# Patient Record
Sex: Female | Born: 1970
Health system: Southern US, Community
[De-identification: ages and names within clinical notes are randomized; demographics above are authoritative.]

## PROBLEM LIST (undated history)

## (undated) DIAGNOSIS — I7300 Raynaud's syndrome without gangrene: Secondary | ICD-10-CM

## (undated) DIAGNOSIS — R87629 Unspecified abnormal cytological findings in specimens from vagina: Secondary | ICD-10-CM

## (undated) DIAGNOSIS — Z8619 Personal history of other infectious and parasitic diseases: Secondary | ICD-10-CM

## (undated) DIAGNOSIS — E785 Hyperlipidemia, unspecified: Secondary | ICD-10-CM

## (undated) DIAGNOSIS — R51 Headache: Secondary | ICD-10-CM

## (undated) DIAGNOSIS — T7840XA Allergy, unspecified, initial encounter: Secondary | ICD-10-CM

## (undated) HISTORY — DX: Headache: R51

## (undated) HISTORY — DX: Personal history of other infectious and parasitic diseases: Z86.19

## (undated) HISTORY — PX: COLPOSCOPY: SHX161

## (undated) HISTORY — DX: Raynaud's syndrome without gangrene: I73.00

## (undated) HISTORY — DX: Allergy, unspecified, initial encounter: T78.40XA

## (undated) HISTORY — DX: Hyperlipidemia, unspecified: E78.5

## (undated) HISTORY — DX: Unspecified abnormal cytological findings in specimens from vagina: R87.629

## (undated) HISTORY — PX: FOOT SURGERY: SHX648

---

## 1994-04-08 HISTORY — PX: BREAST REDUCTION SURGERY: SHX8

## 2004-06-18 ENCOUNTER — Ambulatory Visit: Payer: Self-pay | Admitting: Internal Medicine

## 2005-02-14 ENCOUNTER — Emergency Department (HOSPITAL_COMMUNITY): Admission: EM | Admit: 2005-02-14 | Discharge: 2005-02-14 | Payer: Self-pay | Admitting: Family Medicine

## 2005-03-06 ENCOUNTER — Ambulatory Visit: Payer: Self-pay | Admitting: Internal Medicine

## 2005-08-05 ENCOUNTER — Ambulatory Visit: Payer: Self-pay | Admitting: Internal Medicine

## 2006-12-23 ENCOUNTER — Ambulatory Visit: Payer: Self-pay | Admitting: Internal Medicine

## 2006-12-23 DIAGNOSIS — I7300 Raynaud's syndrome without gangrene: Secondary | ICD-10-CM | POA: Insufficient documentation

## 2006-12-23 DIAGNOSIS — M542 Cervicalgia: Secondary | ICD-10-CM | POA: Insufficient documentation

## 2006-12-24 ENCOUNTER — Encounter (INDEPENDENT_AMBULATORY_CARE_PROVIDER_SITE_OTHER): Payer: Self-pay | Admitting: *Deleted

## 2006-12-24 LAB — CONVERTED CEMR LAB
Chloride: 109 meq/L (ref 96–112)
Cholesterol: 165 mg/dL (ref 0–200)
Creatinine, Ser: 0.9 mg/dL (ref 0.4–1.2)
Eosinophils Absolute: 0.1 10*3/uL (ref 0.0–0.6)
Eosinophils Relative: 2.6 % (ref 0.0–5.0)
GFR calc non Af Amer: 76 mL/min
Glucose, Bld: 83 mg/dL (ref 70–99)
HCT: 40.1 % (ref 36.0–46.0)
Hemoglobin: 13.9 g/dL (ref 12.0–15.0)
MCV: 89.7 fL (ref 78.0–100.0)
Monocytes Absolute: 0.3 10*3/uL (ref 0.2–0.7)
Neutrophils Relative %: 60.1 % (ref 43.0–77.0)
Potassium: 4.5 meq/L (ref 3.5–5.1)
RBC: 4.47 M/uL (ref 3.87–5.11)
Sodium: 142 meq/L (ref 135–145)
Total CHOL/HDL Ratio: 4.6
WBC: 5.3 10*3/uL (ref 4.5–10.5)

## 2007-02-16 ENCOUNTER — Ambulatory Visit: Payer: Self-pay | Admitting: Internal Medicine

## 2007-05-04 ENCOUNTER — Telehealth (INDEPENDENT_AMBULATORY_CARE_PROVIDER_SITE_OTHER): Payer: Self-pay | Admitting: *Deleted

## 2007-05-04 ENCOUNTER — Ambulatory Visit: Payer: Self-pay | Admitting: Internal Medicine

## 2007-07-29 ENCOUNTER — Ambulatory Visit: Payer: Self-pay | Admitting: Internal Medicine

## 2007-07-29 DIAGNOSIS — R51 Headache: Secondary | ICD-10-CM | POA: Insufficient documentation

## 2007-07-29 DIAGNOSIS — R519 Headache, unspecified: Secondary | ICD-10-CM | POA: Insufficient documentation

## 2007-07-29 DIAGNOSIS — R5383 Other fatigue: Secondary | ICD-10-CM

## 2007-07-29 DIAGNOSIS — R5381 Other malaise: Secondary | ICD-10-CM | POA: Insufficient documentation

## 2007-07-29 HISTORY — DX: Headache: R51

## 2007-07-30 ENCOUNTER — Ambulatory Visit: Payer: Self-pay | Admitting: Internal Medicine

## 2007-08-02 ENCOUNTER — Encounter (INDEPENDENT_AMBULATORY_CARE_PROVIDER_SITE_OTHER): Payer: Self-pay | Admitting: *Deleted

## 2007-08-02 LAB — CONVERTED CEMR LAB
Albumin: 3.8 g/dL (ref 3.5–5.2)
BUN: 16 mg/dL (ref 6–23)
Basophils Relative: 0 % (ref 0.0–1.0)
Calcium: 9.4 mg/dL (ref 8.4–10.5)
Creatinine, Ser: 0.9 mg/dL (ref 0.4–1.2)
Eosinophils Absolute: 0.1 10*3/uL (ref 0.0–0.7)
Eosinophils Relative: 1.9 % (ref 0.0–5.0)
GFR calc Af Amer: 92 mL/min
GFR calc non Af Amer: 76 mL/min
HCT: 42.7 % (ref 36.0–46.0)
MCV: 91.1 fL (ref 78.0–100.0)
Monocytes Absolute: 0.3 10*3/uL (ref 0.1–1.0)
RBC: 4.69 M/uL (ref 3.87–5.11)
TSH: 1.95 microintl units/mL (ref 0.35–5.50)
WBC: 7 10*3/uL (ref 4.5–10.5)

## 2007-09-01 ENCOUNTER — Ambulatory Visit: Payer: Self-pay | Admitting: Internal Medicine

## 2007-09-01 DIAGNOSIS — L259 Unspecified contact dermatitis, unspecified cause: Secondary | ICD-10-CM | POA: Insufficient documentation

## 2008-02-01 ENCOUNTER — Ambulatory Visit: Payer: Self-pay | Admitting: Internal Medicine

## 2008-02-01 DIAGNOSIS — K219 Gastro-esophageal reflux disease without esophagitis: Secondary | ICD-10-CM | POA: Insufficient documentation

## 2008-03-15 ENCOUNTER — Ambulatory Visit: Payer: Self-pay | Admitting: Internal Medicine

## 2008-04-05 ENCOUNTER — Ambulatory Visit: Payer: Self-pay | Admitting: Internal Medicine

## 2008-04-05 ENCOUNTER — Telehealth (INDEPENDENT_AMBULATORY_CARE_PROVIDER_SITE_OTHER): Payer: Self-pay | Admitting: *Deleted

## 2008-04-18 ENCOUNTER — Ambulatory Visit: Payer: Self-pay | Admitting: Internal Medicine

## 2008-04-20 LAB — CONVERTED CEMR LAB
Cholesterol: 207 mg/dL (ref 0–200)
Hemoglobin: 14.8 g/dL (ref 12.0–15.0)
Triglycerides: 80 mg/dL (ref 0–149)

## 2008-09-23 ENCOUNTER — Ambulatory Visit: Payer: Self-pay | Admitting: Internal Medicine

## 2008-12-29 ENCOUNTER — Encounter (INDEPENDENT_AMBULATORY_CARE_PROVIDER_SITE_OTHER): Payer: Self-pay | Admitting: *Deleted

## 2009-02-20 ENCOUNTER — Telehealth (INDEPENDENT_AMBULATORY_CARE_PROVIDER_SITE_OTHER): Payer: Self-pay | Admitting: *Deleted

## 2009-02-21 ENCOUNTER — Ambulatory Visit: Payer: Self-pay | Admitting: Internal Medicine

## 2009-12-06 ENCOUNTER — Encounter: Payer: Self-pay | Admitting: Internal Medicine

## 2009-12-07 ENCOUNTER — Encounter (INDEPENDENT_AMBULATORY_CARE_PROVIDER_SITE_OTHER): Payer: Self-pay | Admitting: *Deleted

## 2010-03-05 ENCOUNTER — Ambulatory Visit: Payer: Self-pay | Admitting: Internal Medicine

## 2010-05-10 NOTE — Miscellaneous (Signed)
Summary: [facility]  [facility]   Imported By: Lanelle Bal 12/19/2009 07:56:28  _____________________________________________________________________  External Attachment:    Type:   Image     Comment:   External Document

## 2010-05-10 NOTE — Assessment & Plan Note (Signed)
Summary: TB TEST/[facility]  Nurse Visit  CC: TB skin test/kb   Allergies: 1)  ! Tetracycline Hcl (Tetracycline Hcl) 2)  ! Hydrocodone 3)  ! Naproxen (Naproxen)  Immunizations Administered:  PPD Skin Test:    Vaccine Type: PPD    Site: left forearm    Mfr: Sanofi Pasteur    Dose: 0.1 ml    Route: ID    Given by: Lucious Groves CMA    Exp. Date: 12/08/2011    Lot #: [id number]  PPD Results    Date of reading: 03/07/2010    Results: < 5mm    Interpretation: negative  Orders Added: 1)  TB Skin Test [86580] 2)  Admin 1st Vaccine [45409]

## 2010-05-10 NOTE — Miscellaneous (Signed)
Summary: flu shot   Immunization History:  Influenza Immunization History:    Influenza:  given @ [facility] (12/06/2009)

## 2011-03-22 ENCOUNTER — Ambulatory Visit (INDEPENDENT_AMBULATORY_CARE_PROVIDER_SITE_OTHER): Payer: BC Managed Care – PPO

## 2011-03-22 DIAGNOSIS — Z111 Encounter for screening for respiratory tuberculosis: Secondary | ICD-10-CM

## 2011-03-25 LAB — TB SKIN TEST: Induration: 0

## 2011-11-27 ENCOUNTER — Ambulatory Visit (INDEPENDENT_AMBULATORY_CARE_PROVIDER_SITE_OTHER): Payer: BC Managed Care – PPO | Admitting: Internal Medicine

## 2011-11-27 ENCOUNTER — Encounter: Payer: Self-pay | Admitting: Internal Medicine

## 2011-11-27 VITALS — BP 100/62 | HR 72 | Temp 98.2°F | Wt 136.2 lb

## 2011-11-27 DIAGNOSIS — M79676 Pain in unspecified toe(s): Secondary | ICD-10-CM

## 2011-11-27 DIAGNOSIS — K219 Gastro-esophageal reflux disease without esophagitis: Secondary | ICD-10-CM

## 2011-11-27 DIAGNOSIS — R059 Cough, unspecified: Secondary | ICD-10-CM

## 2011-11-27 DIAGNOSIS — T887XXA Unspecified adverse effect of drug or medicament, initial encounter: Secondary | ICD-10-CM | POA: Insufficient documentation

## 2011-11-27 DIAGNOSIS — R05 Cough: Secondary | ICD-10-CM

## 2011-11-27 DIAGNOSIS — M79609 Pain in unspecified limb: Secondary | ICD-10-CM

## 2011-11-27 MED ORDER — ESOMEPRAZOLE MAGNESIUM 40 MG PO CPDR: DELAYED_RELEASE_CAPSULE | ORAL | Status: DC

## 2011-11-27 NOTE — Addendum Note (Signed)
Addended byPecola Lawless on: 11/27/2011 03:34 PM   Modules accepted: Orders, Level of Service

## 2011-11-27 NOTE — Patient Instructions (Addendum)
The triggers for reflux include stress; the "aspirin family" ; alcohol; peppermint; and caffeine (coffee, tea, cola, and chocolate). The aspirin family would include aspirin and the nonsteroidal agents such as ibuprofen &  Naproxen. Tylenol would not cause reflux. If having symptoms ; food & drink should be avoided for @ least 2 hours before going to bed.  Use an anti-inflammatory cream such as Aspercreme or Zostrix cream twice a day to the area as needed. In lieu of this warm moist compresses or  hot water bottle can be used. Do not apply ice

## 2011-11-27 NOTE — Progress Notes (Signed)
  Subjective:    Patient ID: Jennifer Cook, female    DOB: 09-Jan-1971, 41 y.o.   MRN: [id number]  HPI    Review of Systems     Objective:   Physical Exam        Assessment & Plan:

## 2011-11-27 NOTE — Progress Notes (Signed)
  Subjective:    Patient ID: Jennifer Cook, female    DOB: 06/09/1970, 41 y.o.   MRN: [id number]  HPI She describes intermittent tickling her throat followed by a dry cough. This has been the presentation of her reflux in the past, but a seven-day course of Prilosec OTC was of no benefit. Her reflux has been aggravated by medications in the past. Prolonged talking is the trigger; sometimes she will have symptoms after meals She denies hoarseness, dysphagia,weight loss, abdominal pain, melena, rectal bleeding. She is not on ACE inhibitor. No PMH of asthma    Review of Systems She is also not had frontal headaches, facial pain, nasal purulence, dental pain,or sore throat. There are no extrinsic symptoms of itchy, watery eyes, PNDr or sneezing. She also denies shortness of breath or wheezing.      Objective:   Physical Exam General appearance:good health ;well nourished; no acute distress or increased work of breathing is present.  No  lymphadenopathy about the head, neck, or axilla noted.   Eyes: No conjunctival inflammation or lid edema is present. There is no scleral icterus.  Ears:  External ear exam shows no significant lesions or deformities.  Otoscopic examination reveals clear canals, tympanic membranes are intact bilaterally without bulging, retraction, inflammation or discharge.  Nose:  External nasal examination shows no deformity or inflammation. Nasal mucosa are pink and moist without lesions or exudates. No septal dislocation or deviation.No obstruction to airflow.   Oral exam: Dental hygiene is good; lips and gums are healthy appearing.There is no oropharyngeal erythema or exudate noted.   Neck:  No deformities, thyromegaly, masses, or tenderness noted.    Heart:  Normal rate and regular rhythm. S1 and S2 normal without gallop, murmur, click, rub or other extra sounds.   Lungs:Chest clear to auscultation; no wheezes, rhonchi,rales ,or rubs present.No increased work of  breathing.  Intermittent brief, dry cough  Extremities:  No cyanosis, edema, or clubbing  noted    Skin: Warm & dry          Assessment & Plan:  #1 physical and dry cough which is her manifestation of reflux  Plan: Nexium 40 mg pre-breakfast and evening meal. Review of triggers for reflux

## 2011-11-27 NOTE — Progress Notes (Signed)
  Subjective:    Patient ID: Jennifer Cook, female    DOB: 1970/09/01, 41 y.o.   MRN: [id number]  HPI    Review of Systems She describes positional pain of the right MTP joint for several weeks without specific trauma. She had been hiking on her vacation prior to the onset of symptoms. Flexion of the toe or lateral movement in either direction aggravates this. It is not affected by her running. She has no history of gout     Objective:   Physical Exam pedal pulses are intact. There is no cyanosis clubbing present. There is faint erythema over the base of the right great toe. There is slight tenderness to palpation. There is no increased temperature      Assessment & Plan:  #1 localized joint pain; doubt would be unlikely but uric acid will be checked. Topical anti-inflammatory agents are recommended. If uric acid is normal sports medicine referral recommended to prevent permanent injury

## 2011-11-28 LAB — URIC ACID: Uric Acid, Serum: 2.8 mg/dL (ref 2.4–7.0)

## 2012-03-24 ENCOUNTER — Ambulatory Visit (INDEPENDENT_AMBULATORY_CARE_PROVIDER_SITE_OTHER): Payer: BC Managed Care – PPO | Admitting: *Deleted

## 2012-03-24 DIAGNOSIS — Z111 Encounter for screening for respiratory tuberculosis: Secondary | ICD-10-CM

## 2012-03-26 ENCOUNTER — Encounter: Payer: Self-pay | Admitting: *Deleted

## 2012-05-23 ENCOUNTER — Other Ambulatory Visit: Payer: Self-pay

## 2012-11-30 ENCOUNTER — Encounter: Payer: BC Managed Care – PPO | Admitting: Internal Medicine

## 2013-02-01 ENCOUNTER — Ambulatory Visit (INDEPENDENT_AMBULATORY_CARE_PROVIDER_SITE_OTHER): Payer: BC Managed Care – PPO | Admitting: *Deleted

## 2013-02-01 DIAGNOSIS — A184 Tuberculosis of skin and subcutaneous tissue: Secondary | ICD-10-CM

## 2013-02-01 DIAGNOSIS — IMO0001 Reserved for inherently not codable concepts without codable children: Secondary | ICD-10-CM

## 2013-02-03 ENCOUNTER — Encounter: Payer: Self-pay | Admitting: *Deleted

## 2013-02-11 ENCOUNTER — Other Ambulatory Visit: Payer: Self-pay

## 2013-02-25 ENCOUNTER — Telehealth: Payer: Self-pay

## 2013-02-25 NOTE — Telephone Encounter (Signed)
Left message for call back identifiable  Pap? MMG? Flu? Td 02/2005

## 2013-02-26 ENCOUNTER — Encounter: Payer: BC Managed Care – PPO | Admitting: Internal Medicine

## 2013-02-26 DIAGNOSIS — Z0289 Encounter for other administrative examinations: Secondary | ICD-10-CM

## 2013-02-26 NOTE — Telephone Encounter (Signed)
Unable to reach pre visit. No show.  

## 2013-04-08 LAB — HM PAP SMEAR

## 2013-09-06 LAB — HM MAMMOGRAPHY

## 2013-09-24 ENCOUNTER — Telehealth: Payer: Self-pay

## 2013-09-24 NOTE — Telephone Encounter (Signed)
Patient called to review varicella titer from 2009. States that it was just rejected by a healthcare facility who advised patient that she was not immune. Patient results 3.30 which interrupts as positive for the antibody. States that is her understanding.

## 2013-11-29 ENCOUNTER — Encounter: Payer: Self-pay | Admitting: Internal Medicine

## 2013-11-29 ENCOUNTER — Ambulatory Visit (INDEPENDENT_AMBULATORY_CARE_PROVIDER_SITE_OTHER): Payer: BC Managed Care – PPO | Admitting: Internal Medicine

## 2013-11-29 VITALS — BP 110/54 | HR 70 | Temp 97.8°F | Ht 64.0 in | Wt 143.5 lb

## 2013-11-29 DIAGNOSIS — Z0000 Encounter for general adult medical examination without abnormal findings: Secondary | ICD-10-CM

## 2013-11-29 DIAGNOSIS — R51 Headache: Secondary | ICD-10-CM

## 2013-11-29 LAB — COMPREHENSIVE METABOLIC PANEL
ALK PHOS: 37 U/L — AB (ref 39–117)
ALT: 15 U/L (ref 0–35)
AST: 18 U/L (ref 0–37)
Albumin: 4 g/dL (ref 3.5–5.2)
BILIRUBIN TOTAL: 0.6 mg/dL (ref 0.2–1.2)
BUN: 14 mg/dL (ref 6–23)
CO2: 27 mEq/L (ref 19–32)
Calcium: 9 mg/dL (ref 8.4–10.5)
Chloride: 103 mEq/L (ref 96–112)
Creatinine, Ser: 0.8 mg/dL (ref 0.4–1.2)
GFR: 81.18 mL/min (ref >60.00)
GLUCOSE: 74 mg/dL (ref 70–99)
Potassium: 4.1 mEq/L (ref 3.5–5.1)
SODIUM: 138 meq/L (ref 135–145)
TOTAL PROTEIN: 7.6 g/dL (ref 6.0–8.3)

## 2013-11-29 LAB — CBC WITH DIFFERENTIAL/PLATELET
BASOS PCT: 0.3 % (ref 0.0–3.0)
Basophils Absolute: 0 10*3/uL (ref 0.0–0.1)
EOS PCT: 0.9 % (ref 0.0–5.0)
Eosinophils Absolute: 0.1 10*3/uL (ref 0.0–0.7)
HEMATOCRIT: 42.8 % (ref 36.0–46.0)
Hemoglobin: 14.3 g/dL (ref 12.0–15.0)
LYMPHS ABS: 2.1 10*3/uL (ref 0.7–4.0)
Lymphocytes Relative: 25.3 % (ref 12.0–46.0)
MCHC: 33.3 g/dL (ref 30.0–36.0)
MCV: 92.9 fl (ref 78.0–100.0)
MONO ABS: 0.4 10*3/uL (ref 0.1–1.0)
Monocytes Relative: 4.8 % (ref 3.0–12.0)
Neutro Abs: 5.6 10*3/uL (ref 1.4–7.7)
Neutrophils Relative %: 68.7 % (ref 43.0–77.0)
PLATELETS: 276 10*3/uL (ref 150.0–400.0)
RBC: 4.61 Mil/uL (ref 3.87–5.11)
RDW: 13.4 % (ref 11.5–15.5)
WBC: 8.2 10*3/uL (ref 4.0–10.5)

## 2013-11-29 LAB — LIPID PANEL
CHOL/HDL RATIO: 5
Cholesterol: 210 mg/dL — ABNORMAL HIGH (ref 0–200)
HDL: 41.9 mg/dL (ref >39.00)
LDL CALC: 145 mg/dL — AB (ref 0–99)
NONHDL: 168.1
Triglycerides: 116 mg/dL (ref 0.0–149.0)
VLDL: 23.2 mg/dL (ref 0.0–40.0)

## 2013-11-29 LAB — TSH: TSH: 1.47 u[IU]/mL (ref 0.35–4.50)

## 2013-11-29 NOTE — Patient Instructions (Signed)
Get your blood work before you leave   Come back in 2 days to check your PPD (TB test)   Next visit is for a physical exam in 1 year, fasting Please make an appointment

## 2013-11-29 NOTE — Progress Notes (Signed)
Pre-visit discussion using our clinic review tool. No additional management support is needed unless otherwise documented below in the visit note.  

## 2013-11-29 NOTE — Progress Notes (Signed)
   Subjective:    Patient ID: Jennifer Cook, female    DOB: 1971-02-10, 43 y.o.   MRN: [id number]  DOS:  11/29/2013 Type of visit - description: CPX History: In general feels well    ROS Diet--  healthy Exercise-- on-off depending on available time   No  CP, SOB Denies  nausea, vomiting diarrhea, blood in the stools  No GERD  Sx. No dysuria, gross hematuria, difficulty urinating  No anxiety, depression   Past Medical History  Diagnosis Date  . HEADACHE 07/29/2007    Qualifier: Diagnosis of  By: Larose Kells MD, [place]     Past Surgical History  Procedure Laterality Date  . Cesarean section  07-1997    History   Social History  . Marital Status: Married    Spouse Name: N/A    Number of Children: 2   . Years of Education: N/A   Occupational History  . sales , medical    Social History Main Topics  . Smoking status: Never Smoker   . Smokeless tobacco: Never Used  . Alcohol Use: Yes     Comment: Rarely  . Drug Use: No  . Sexual Activity: Not on file   Other Topics Concern  . Not on file   Social History Narrative   Lives w/ husband and 2 boys      Family History  Problem Relation Age of Onset  . Diabetes Neg Hx   . CAD Father     F (~ 39 y/o, MI), GM  . Breast cancer Other     aunt x 2   . Colon cancer Neg Hx        Medication List       This list is accurate as of: 11/29/13 11:59 PM.  Always use your most recent med list.               AMBULATORY NON FORMULARY MEDICATION  Medication Name: BCP pill           Objective:   Physical Exam BP 110/54  Pulse 70  Temp(Src) 97.8 F (36.6 C) (Oral)  Ht 5\' 4"  (1.626 m)  Wt 143 lb 8 oz (65.091 kg)  BMI 24.62 kg/m2  SpO2 99%  LMP 10/13/2013 General -- alert, well-developed, NAD.  Neck --no thyromegaly  HEENT-- Not pale.  Lungs -- normal respiratory effort, no intercostal retractions, no accessory muscle use, and normal breath sounds.  Heart-- normal rate, regular rhythm, no murmur.    Abdomen-- Not distended, good bowel sounds,soft, non-tender.  Extremities-- no pretibial edema bilaterally  Neurologic--  alert & oriented X3. Speech normal, gait appropriate for age, strength symmetric and appropriate for age.  Psych-- Cognition and judgment appear intact. Cooperative with normal attention span and concentration. No anxious or depressed appearing.       Assessment & Plan:

## 2013-11-29 NOTE — Assessment & Plan Note (Signed)
Improved w/ BCP

## 2013-11-29 NOTE — Assessment & Plan Note (Addendum)
Td 06, usually at [facility] Never had a cscope Gyn-- [facility] Labs + FH CAD, EKG today for baseline--low voltage but otherwise normal (checking a TSH) Diet-exercise discussed

## 2013-12-01 ENCOUNTER — Ambulatory Visit: Payer: BC Managed Care – PPO

## 2013-12-01 LAB — TB SKIN TEST
INDURATION: 0 mm
TB Skin Test: NEGATIVE

## 2013-12-29 ENCOUNTER — Telehealth: Payer: Self-pay | Admitting: Internal Medicine

## 2013-12-29 NOTE — Telephone Encounter (Signed)
Pt had tb test done 8/24, states she is needing documentation for her job stating that the tb test was negative, pt states she can pick up the documentation when available.

## 2013-12-29 NOTE — Telephone Encounter (Signed)
TB Test results printed and Pt informed that the result is at front desk for pick up.

## 2014-01-07 ENCOUNTER — Telehealth: Payer: Self-pay | Admitting: Internal Medicine

## 2014-01-07 NOTE — Telephone Encounter (Signed)
Caller name: Khia  Relation to pt: self  Call back number: [phone]   Reason for call:   patient requesting a doctor note stating paitent received TB please include the D.O.S, on letterhead with address and name of physician. Pt will pick up when ready.

## 2014-01-07 NOTE — Telephone Encounter (Signed)
LMOM that letter is ready for pick up at front desk.

## 2014-09-12 ENCOUNTER — Telehealth: Payer: Self-pay | Admitting: Internal Medicine

## 2014-09-12 DIAGNOSIS — Z0000 Encounter for general adult medical examination without abnormal findings: Secondary | ICD-10-CM

## 2014-09-12 NOTE — Telephone Encounter (Signed)
Relation to pt: self  Call back number: [phone]   Reason for call:  Pt scheduled physical for 12/27/2014. Requesting lab work approximately 2 weeks prior. Pt states insurance will cover. Please advise

## 2014-09-12 NOTE — Telephone Encounter (Signed)
CMP, FLP, vitamin D --- dx  CPX

## 2014-09-12 NOTE — Telephone Encounter (Signed)
Labs ordered, Pt can schedule lab appt at her convenience, she will need to be fasting.

## 2014-09-12 NOTE — Telephone Encounter (Signed)
FYI. Should Pt wait until appt?

## 2014-09-13 NOTE — Telephone Encounter (Signed)
LVM advising pt to schedule lab appointment prior to physical

## 2014-12-13 ENCOUNTER — Other Ambulatory Visit (INDEPENDENT_AMBULATORY_CARE_PROVIDER_SITE_OTHER): Payer: BLUE CROSS/BLUE SHIELD

## 2014-12-13 DIAGNOSIS — Z0000 Encounter for general adult medical examination without abnormal findings: Secondary | ICD-10-CM | POA: Diagnosis not present

## 2014-12-13 LAB — LIPID PANEL
CHOL/HDL RATIO: 4
Cholesterol: 201 mg/dL — ABNORMAL HIGH (ref 0–200)
HDL: 45.7 mg/dL (ref >39.00)
LDL Cholesterol: 136 mg/dL — ABNORMAL HIGH (ref 0–99)
NONHDL: 155.65
Triglycerides: 98 mg/dL (ref 0.0–149.0)
VLDL: 19.6 mg/dL (ref 0.0–40.0)

## 2014-12-13 LAB — COMPREHENSIVE METABOLIC PANEL
ALT: 13 U/L (ref 0–35)
AST: 16 U/L (ref 0–37)
Albumin: 4.1 g/dL (ref 3.5–5.2)
Alkaline Phosphatase: 39 U/L (ref 39–117)
BILIRUBIN TOTAL: 0.5 mg/dL (ref 0.2–1.2)
BUN: 15 mg/dL (ref 6–23)
CALCIUM: 9.1 mg/dL (ref 8.4–10.5)
CO2: 26 meq/L (ref 19–32)
Chloride: 106 mEq/L (ref 96–112)
Creatinine, Ser: 0.94 mg/dL (ref 0.40–1.20)
GFR: 69 mL/min (ref >60.00)
GLUCOSE: 81 mg/dL (ref 70–99)
Potassium: 3.8 mEq/L (ref 3.5–5.1)
Sodium: 139 mEq/L (ref 135–145)
TOTAL PROTEIN: 7.2 g/dL (ref 6.0–8.3)

## 2014-12-16 LAB — VITAMIN D 1,25 DIHYDROXY
VITAMIN D3 1, 25 (OH): 62 pg/mL
Vitamin D 1, 25 (OH)2 Total: 62 pg/mL (ref 18–72)

## 2014-12-26 ENCOUNTER — Telehealth: Payer: Self-pay | Admitting: Behavioral Health

## 2014-12-26 ENCOUNTER — Encounter: Payer: Self-pay | Admitting: Behavioral Health

## 2014-12-26 NOTE — Telephone Encounter (Signed)
Pre-Visit Call completed with patient and chart updated.   Pre-Visit Info documented in Specialty Comments under [employer].    

## 2014-12-27 ENCOUNTER — Encounter: Payer: Self-pay | Admitting: Internal Medicine

## 2014-12-27 ENCOUNTER — Ambulatory Visit (INDEPENDENT_AMBULATORY_CARE_PROVIDER_SITE_OTHER): Payer: BLUE CROSS/BLUE SHIELD | Admitting: Internal Medicine

## 2014-12-27 VITALS — BP 112/72 | HR 75 | Temp 98.2°F | Ht 65.0 in | Wt 146.0 lb

## 2014-12-27 DIAGNOSIS — Z111 Encounter for screening for respiratory tuberculosis: Secondary | ICD-10-CM

## 2014-12-27 DIAGNOSIS — Z23 Encounter for immunization: Secondary | ICD-10-CM

## 2014-12-27 DIAGNOSIS — Z0000 Encounter for general adult medical examination without abnormal findings: Secondary | ICD-10-CM | POA: Diagnosis not present

## 2014-12-27 NOTE — Patient Instructions (Signed)
Get your blood work before you leave     Next visit  for a physical exam in one year, fasting     Please schedule an appointment at the front desk

## 2014-12-27 NOTE — Progress Notes (Signed)
Subjective:    Patient ID: Jennifer Cook, female    DOB: 26-Apr-1970, 44 y.o.   MRN: [id number]  DOS:  12/27/2014 Type of visit - description :  CPX Interval history: No concerns, feeling well    Review of Systems  Constitutional: No fever. No chills. No unexplained wt changes. Occasionally excessive sweats in the morning, no truly hot flashes.  HEENT: No dental problems, no ear discharge, no facial swelling, no voice changes. No eye discharge, no eye  redness , no  intolerance to light   Respiratory: No wheezing , no  difficulty breathing. No cough , no mucus production  Cardiovascular: No CP, no leg swelling , no  Palpitations  GI: no nausea, no vomiting, no diarrhea , no  abdominal pain.  No blood in the stools. No dysphagia, no odynophagia    Endocrine: No polyphagia, no polyuria , no polydipsia  GU: No dysuria, gross hematuria, difficulty urinating. No urinary urgency, no frequency.  Musculoskeletal: No joint swellings or unusual aches or pains  Skin: No change in the color of the skin, palor , no  Rash  Allergic, immunologic: No environmental allergies , no  food allergies  Neurological: No dizziness no  syncope. No headaches. No diplopia, no slurred, no slurred speech, no motor deficits, no facial  Numbness  Hematological: No enlarged lymph nodes, no easy bruising , no unusual bleedings  Psychiatry: No suicidal ideas, no hallucinations, no beavior problems, no confusion.  No unusual/severe anxiety, no depression   Past Medical History  Diagnosis Date  . HEADACHE 07/29/2007    Qualifier: Diagnosis of  By: Larose Kells MD, [place]     Past Surgical History  Procedure Laterality Date  . Cesarean section  07-1997    Social History   Social History  . Marital Status: Married    Spouse Name: N/A  . Number of Children: 2   . Years of Education: N/A   Occupational History  . sales , medical    Social History Main Topics  . Smoking status: Never Smoker   .  Smokeless tobacco: Never Used  . Alcohol Use: Yes     Comment: Rarely  . Drug Use: No  . Sexual Activity: Not on file   Other Topics Concern  . Not on file   Social History Narrative   Lives w/ husband and 2 boys (65, 83 y/o)         Family History  Problem Relation Age of Onset  . Diabetes Neg Hx   . CAD Father     F (~ 22 y/o, MI), GM  . Breast cancer Other     aunt x 2   . Colon cancer Neg Hx       Medication List       This list is accurate as of: 12/27/14  6:17 PM.  Always use your most recent med list.               QUASENSE 0.15-0.03 MG tablet  Generic drug:  levonorgestrel-ethinyl estradiol  Take 1 tablet by mouth daily.           Objective:   Physical Exam BP 112/72 mmHg  Pulse 75  Temp(Src) 98.2 F (36.8 C) (Oral)  Ht 5\' 5"  (1.651 m)  Wt 146 lb (66.225 kg)  BMI 24.30 kg/m2  SpO2 99%  LMP 09/12/2014 (Approximate) General:   Well developed, well nourished . NAD.  HEENT:  Normocephalic . Face symmetric, atraumatic Neck: No thyromegaly Lungs:  CTA B Normal respiratory effort, no intercostal retractions, no accessory muscle use. Heart: RRR,  no murmur.  no pretibial edema bilaterally  Abdomen:  Not distended, soft, non-tender. No rebound or rigidity. No mass,organomegaly Skin: Not pale. Not jaundice Neurologic:  alert & oriented X3.  Speech normal, gait appropriate for age and unassisted Psych--  Cognition and judgment appear intact.  Cooperative with normal attention span and concentration.  Behavior appropriate. No anxious or depressed appearing.    Assessment & Plan:

## 2014-12-27 NOTE — Progress Notes (Signed)
Pre visit review using our clinic review tool, if applicable. No additional management support is needed unless otherwise documented below in the visit note. 

## 2014-12-27 NOTE — Assessment & Plan Note (Addendum)
Td 06 and  today Flu shot-- today Never had a cscope Gyn-- [facility] Recent labs reviewed, cholesterol LDL very close to her goal of 130.  Needs a TB test + FH CAD-- pt is asx Diet-exercise discussed , has been doing better this year than previously.

## 2014-12-30 LAB — QUANTIFERON TB GOLD ASSAY (BLOOD)
INTERFERON GAMMA RELEASE ASSAY: NEGATIVE
Quantiferon Nil Value: 0.05 IU/mL
Quantiferon Tb Ag Minus Nil Value: -0.02 IU/mL
TB Ag value: 0.03 IU/mL

## 2015-11-20 ENCOUNTER — Encounter: Payer: Self-pay | Admitting: Internal Medicine

## 2015-11-27 ENCOUNTER — Other Ambulatory Visit (INDEPENDENT_AMBULATORY_CARE_PROVIDER_SITE_OTHER): Payer: BLUE CROSS/BLUE SHIELD

## 2015-11-27 ENCOUNTER — Encounter: Payer: Self-pay | Admitting: Internal Medicine

## 2015-11-27 ENCOUNTER — Ambulatory Visit (INDEPENDENT_AMBULATORY_CARE_PROVIDER_SITE_OTHER): Payer: BLUE CROSS/BLUE SHIELD | Admitting: Internal Medicine

## 2015-11-27 VITALS — BP 108/62 | HR 73 | Temp 98.1°F | Resp 12 | Ht 65.0 in | Wt 151.2 lb

## 2015-11-27 DIAGNOSIS — Z09 Encounter for follow-up examination after completed treatment for conditions other than malignant neoplasm: Secondary | ICD-10-CM | POA: Insufficient documentation

## 2015-11-27 DIAGNOSIS — Z0000 Encounter for general adult medical examination without abnormal findings: Secondary | ICD-10-CM | POA: Diagnosis not present

## 2015-11-27 DIAGNOSIS — O99810 Abnormal glucose complicating pregnancy: Secondary | ICD-10-CM | POA: Diagnosis not present

## 2015-11-27 DIAGNOSIS — Z111 Encounter for screening for respiratory tuberculosis: Secondary | ICD-10-CM

## 2015-11-27 DIAGNOSIS — Z114 Encounter for screening for human immunodeficiency virus [HIV]: Secondary | ICD-10-CM

## 2015-11-27 LAB — CBC WITH DIFFERENTIAL/PLATELET
BASOS ABS: 0 10*3/uL (ref 0.0–0.1)
Basophils Relative: 0.3 % (ref 0.0–3.0)
EOS ABS: 0.1 10*3/uL (ref 0.0–0.7)
Eosinophils Relative: 1.7 % (ref 0.0–5.0)
HEMATOCRIT: 42.8 % (ref 36.0–46.0)
HEMOGLOBIN: 14.6 g/dL (ref 12.0–15.0)
LYMPHS PCT: 21.9 % (ref 12.0–46.0)
Lymphs Abs: 1.6 10*3/uL (ref 0.7–4.0)
MCHC: 34 g/dL (ref 30.0–36.0)
MCV: 90.9 fl (ref 78.0–100.0)
MONO ABS: 0.4 10*3/uL (ref 0.1–1.0)
Monocytes Relative: 5.2 % (ref 3.0–12.0)
NEUTROS ABS: 5.2 10*3/uL (ref 1.4–7.7)
Neutrophils Relative %: 70.9 % (ref 43.0–77.0)
PLATELETS: 260 10*3/uL (ref 150.0–400.0)
RBC: 4.7 Mil/uL (ref 3.87–5.11)
RDW: 12.9 % (ref 11.5–15.5)
WBC: 7.3 10*3/uL (ref 4.0–10.5)

## 2015-11-27 LAB — GLUCOSE, RANDOM: Glucose, Bld: 80 mg/dL (ref 70–99)

## 2015-11-27 LAB — LIPID PANEL
CHOL/HDL RATIO: 5
Cholesterol: 198 mg/dL (ref 0–200)
HDL: 40.5 mg/dL (ref >39.00)
LDL CALC: 127 mg/dL — AB (ref 0–99)
NonHDL: 157.14
Triglycerides: 152 mg/dL — ABNORMAL HIGH (ref 0.0–149.0)
VLDL: 30.4 mg/dL (ref 0.0–40.0)

## 2015-11-27 LAB — TSH: TSH: 2.61 u[IU]/mL (ref 0.35–4.50)

## 2015-11-27 LAB — HIV ANTIBODY (ROUTINE TESTING W REFLEX): HIV: NONREACTIVE

## 2015-11-27 NOTE — Patient Instructions (Signed)
GO TO THE LAB : Get the blood work     GO TO THE FRONT DESK Schedule your next appointment   for a physical exam in 1 year 

## 2015-11-27 NOTE — Progress Notes (Signed)
Pre visit review using our clinic review tool, if applicable. No additional management support is needed unless otherwise documented below in the visit note. 

## 2015-11-27 NOTE — Assessment & Plan Note (Signed)
45 year old female, here for CPX, doing well, asymptomatic. RTC  1 year

## 2015-11-27 NOTE — Assessment & Plan Note (Addendum)
Td 2016 Never had a cscope Female care:  Per Gyn @ [facility]  ([facility] Labs: HIV, FLP, CBC, TSH Needs a TB test + FH CAD-- pt is asx Diet-exercise discussed

## 2015-11-27 NOTE — Progress Notes (Signed)
Subjective:    Patient ID: Jennifer Cook, female    DOB: [birth year], 45 y.o.   MRN: [id number]  DOS:  11/27/2015 Type of visit - description : CPX Interval history: No major concerns    Review of Systems Constitutional: No fever. No chills. No unexplained wt changes. No unusual sweats  HEENT: No dental problems, no ear discharge, no facial swelling, no voice changes. No eye discharge, no eye  redness , no  intolerance to light   Respiratory: No wheezing , no  difficulty breathing. No cough , no mucus production  Cardiovascular: No CP, no leg swelling , no  Palpitations  GI: no nausea, no vomiting, no diarrhea , no  abdominal pain.  No blood in the stools. No dysphagia, no odynophagia    Endocrine: No polyphagia, no polyuria , no polydipsia  GU: No dysuria, gross hematuria, difficulty urinating. No urinary urgency, no frequency.  Musculoskeletal: No joint swellings or unusual aches or pains  Skin: No change in the color of the skin, palor , no  Rash  Allergic, immunologic: No environmental allergies , no  food allergies  Neurological: No dizziness no  syncope. No headaches. No diplopia, no slurred, no slurred speech, no motor deficits, no facial  Numbness  Hematological: No enlarged lymph nodes, no easy bruising , no unusual bleedings  Psychiatry: No suicidal ideas, no hallucinations, no beavior problems, no confusion.  Some stress at work but no anxiety or depression per se  Past Medical History:  Diagnosis Date  . Abnormal vaginal Pap smear   . HEADACHE 07/29/2007   Qualifier: Diagnosis of  By: Larose Kells MD, [place]     Past Surgical History:  Procedure Laterality Date  . CESAREAN SECTION  [phone]  . COLPOSCOPY      Social History   Social History  . Marital status: Married    Spouse name: N/A  . Number of children: 2   . Years of education: N/A   Occupational History  . sales , [employer] worker   Social History Main Topics  . Smoking status: Never  Smoker  . Smokeless tobacco: Never Used  . Alcohol use Yes     Comment: Rarely  . Drug use: No  . Sexual activity: Not on file   Other Topics Concern  . Not on file   Social History Narrative   Lives w/ husband and 2 boys (43, 19 y/o)         Family History  Problem Relation Age of Onset  . CAD Father     F (~ 40 y/o, MI), GM  . Breast cancer Other     aunt x 2   . Diabetes Neg Hx   . Colon cancer Neg Hx        Medication List       Accurate as of 11/27/15  4:58 PM. Always use your most recent med list.          QUASENSE 0.15-0.03 MG tablet Generic drug:  levonorgestrel-ethinyl estradiol Take 1 tablet by mouth daily.          Objective:   Physical Exam BP 108/62 (BP Location: Left Arm, Patient Position: Sitting, Cuff Size: Normal)   Pulse 73   Temp 98.1 F (36.7 C) (Oral)   Resp 12   Ht 5\' 5"  (1.651 m)   Wt 151 lb 4 oz (68.6 kg)   SpO2 98%   BMI 25.17 kg/m   General:   Well developed, well nourished .  NAD.  Neck: No  thyromegaly  HEENT:  Normocephalic . Face symmetric, atraumatic Lungs:  CTA B Normal respiratory effort, no intercostal retractions, no accessory muscle use. Heart: RRR,  no murmur.  No pretibial edema bilaterally  Abdomen:  Not distended, soft, non-tender. No rebound or rigidity.   Skin: Exposed areas without rash. Not pale. Not jaundice Neurologic:  alert & oriented X3.  Speech normal, gait appropriate for age and unassisted Strength symmetric and appropriate for age.  Psych: Cognition and judgment appear intact.  Cooperative with normal attention span and concentration.  Behavior appropriate. No anxious or depressed appearing.    Assessment & Plan:   Assessment Headaches Hx of abnormal Pap smear  PLAN: 45 year old female, here for CPX, doing well, asymptomatic. RTC  1 year

## 2015-11-28 LAB — QUANTIFERON TB GOLD ASSAY (BLOOD)
INTERFERON GAMMA RELEASE ASSAY: NEGATIVE
Mitogen-Nil: >10 IU/mL
QUANTIFERON TB AG MINUS NIL: 0.02 [IU]/mL
Quantiferon Nil Value: 0.02 IU/mL

## 2017-04-23 ENCOUNTER — Ambulatory Visit: Payer: BLUE CROSS/BLUE SHIELD | Admitting: Nurse Practitioner

## 2017-05-02 ENCOUNTER — Ambulatory Visit: Payer: BLUE CROSS/BLUE SHIELD | Admitting: Nurse Practitioner

## 2017-05-02 ENCOUNTER — Encounter: Payer: Self-pay | Admitting: Nurse Practitioner

## 2017-05-02 VITALS — BP 100/64 | HR 80 | Temp 97.8°F | Ht 65.0 in | Wt 161.0 lb

## 2017-05-02 DIAGNOSIS — Z0000 Encounter for general adult medical examination without abnormal findings: Secondary | ICD-10-CM | POA: Diagnosis not present

## 2017-05-02 DIAGNOSIS — Z8249 Family history of ischemic heart disease and other diseases of the circulatory system: Secondary | ICD-10-CM | POA: Diagnosis not present

## 2017-05-02 DIAGNOSIS — Z136 Encounter for screening for cardiovascular disorders: Secondary | ICD-10-CM

## 2017-05-02 DIAGNOSIS — Z13220 Encounter for screening for lipoid disorders: Secondary | ICD-10-CM

## 2017-05-02 LAB — LIPID PANEL
Cholesterol: 217 mg/dL — ABNORMAL HIGH (ref 0–200)
HDL: 42 mg/dL (ref >39.00)
LDL Cholesterol: 154 mg/dL — ABNORMAL HIGH (ref 0–99)
NONHDL: 175.24
Total CHOL/HDL Ratio: 5
Triglycerides: 104 mg/dL (ref 0.0–149.0)
VLDL: 20.8 mg/dL (ref 0.0–40.0)

## 2017-05-02 LAB — CBC WITH DIFFERENTIAL/PLATELET
Basophils Absolute: 0.1 10*3/uL (ref 0.0–0.1)
Basophils Relative: 1.4 % (ref 0.0–3.0)
EOS PCT: 0.9 % (ref 0.0–5.0)
Eosinophils Absolute: 0.1 10*3/uL (ref 0.0–0.7)
HCT: 41.3 % (ref 36.0–46.0)
Hemoglobin: 13.8 g/dL (ref 12.0–15.0)
LYMPHS ABS: 1.8 10*3/uL (ref 0.7–4.0)
Lymphocytes Relative: 26.9 % (ref 12.0–46.0)
MCHC: 33.5 g/dL (ref 30.0–36.0)
MCV: 92.5 fl (ref 78.0–100.0)
MONO ABS: 0.3 10*3/uL (ref 0.1–1.0)
MONOS PCT: 5.3 % (ref 3.0–12.0)
NEUTROS ABS: 4.3 10*3/uL (ref 1.4–7.7)
NEUTROS PCT: 65.5 % (ref 43.0–77.0)
PLATELETS: 273 10*3/uL (ref 150.0–400.0)
RBC: 4.46 Mil/uL (ref 3.87–5.11)
RDW: 12.8 % (ref 11.5–15.5)
WBC: 6.5 10*3/uL (ref 4.0–10.5)

## 2017-05-02 LAB — COMPREHENSIVE METABOLIC PANEL
ALT: 12 U/L (ref 0–35)
AST: 14 U/L (ref 0–37)
Albumin: 4.1 g/dL (ref 3.5–5.2)
Alkaline Phosphatase: 41 U/L (ref 39–117)
BILIRUBIN TOTAL: 0.5 mg/dL (ref 0.2–1.2)
BUN: 13 mg/dL (ref 6–23)
CO2: 26 meq/L (ref 19–32)
Calcium: 8.9 mg/dL (ref 8.4–10.5)
Chloride: 104 mEq/L (ref 96–112)
Creatinine, Ser: 0.75 mg/dL (ref 0.40–1.20)
GFR: 88.57 mL/min (ref >60.00)
GLUCOSE: 83 mg/dL (ref 70–99)
Potassium: 3.6 mEq/L (ref 3.5–5.1)
Sodium: 136 mEq/L (ref 135–145)
Total Protein: 7 g/dL (ref 6.0–8.3)

## 2017-05-02 LAB — TSH: TSH: 1.82 u[IU]/mL (ref 0.35–4.50)

## 2017-05-02 NOTE — Patient Instructions (Addendum)
Please have mammogram results faxed to [place].  ECG is normal, no change compared to previous ECG.  Go to lab for blood draw. You will be called with results  Return to office for TB skin test next week.  Health Maintenance, Female Adopting a healthy lifestyle and getting preventive care can go a long way to promote health and wellness. Talk with your health care provider about what schedule of regular examinations is right for you. This is a good chance for you to check in with your provider about disease prevention and staying healthy. In between checkups, there are plenty of things you can do on your own. Experts have done a lot of research about which lifestyle changes and preventive measures are most likely to keep you healthy. Ask your health care provider for more information. Weight and diet Eat a healthy diet  Be sure to include plenty of vegetables, fruits, low-fat dairy products, and lean protein.  Do not eat a lot of foods high in solid fats, added sugars, or salt.  Get regular exercise. This is one of the most important things you can do for your health. ? Most adults should exercise for at least 150 minutes each week. The exercise should increase your heart rate and make you sweat (moderate-intensity exercise). ? Most adults should also do strengthening exercises at least twice a week. This is in addition to the moderate-intensity exercise.  Maintain a healthy weight  Body mass index (BMI) is a measurement that can be used to identify possible weight problems. It estimates body fat based on height and weight. Your health care provider can help determine your BMI and help you achieve or maintain a healthy weight.  For females 76 years of age and older: ? A BMI below 18.5 is considered underweight. ? A BMI of 18.5 to 24.9 is normal. ? A BMI of 25 to 29.9 is considered overweight. ? A BMI of 30 and above is considered obese.  Watch levels of cholesterol and blood lipids  You  should start having your blood tested for lipids and cholesterol at 46 years of age, then have this test every 5 years.  You may need to have your cholesterol levels checked more often if: ? Your lipid or cholesterol levels are high. ? You are older than 46 years of age. ? You are at high risk for heart disease.  Cancer screening Lung Cancer  Lung cancer screening is recommended for adults 25-8 years old who are at high risk for lung cancer because of a history of smoking.  A yearly low-dose CT scan of the lungs is recommended for people who: ? Currently smoke. ? Have quit within the past 15 years. ? Have at least a 30-pack-year history of smoking. A pack year is smoking an average of one pack of cigarettes a day for 1 year.  Yearly screening should continue until it has been 15 years since you quit.  Yearly screening should stop if you develop a health problem that would prevent you from having lung cancer treatment.  Breast Cancer  Practice breast self-awareness. This means understanding how your breasts normally appear and feel.  It also means doing regular breast self-exams. Let your health care provider know about any changes, no matter how small.  If you are in your 20s or 30s, you should have a clinical breast exam (CBE) by a health care provider every 1-3 years as part of a regular health exam.  If you are 40 or  older, have a CBE every year. Also consider having a breast X-ray (mammogram) every year.  If you have a family history of breast cancer, talk to your health care provider about genetic screening.  If you are at high risk for breast cancer, talk to your health care provider about having an MRI and a mammogram every year.  Breast cancer gene (BRCA) assessment is recommended for women who have family members with BRCA-related cancers. BRCA-related cancers include: ? Breast. ? Ovarian. ? Tubal. ? Peritoneal cancers.  Results of the assessment will determine the  need for genetic counseling and BRCA1 and BRCA2 testing.  Cervical Cancer Your health care provider may recommend that you be screened regularly for cancer of the pelvic organs (ovaries, uterus, and vagina). This screening involves a pelvic examination, including checking for microscopic changes to the surface of your cervix (Pap test). You may be encouraged to have this screening done every 3 years, beginning at age 60.  For women ages 100-65, health care providers may recommend pelvic exams and Pap testing every 3 years, or they may recommend the Pap and pelvic exam, combined with testing for human papilloma virus (HPV), every 5 years. Some types of HPV increase your risk of cervical cancer. Testing for HPV may also be done on women of any age with unclear Pap test results.  Other health care providers may not recommend any screening for nonpregnant women who are considered low risk for pelvic cancer and who do not have symptoms. Ask your health care provider if a screening pelvic exam is right for you.  If you have had past treatment for cervical cancer or a condition that could lead to cancer, you need Pap tests and screening for cancer for at least 20 years after your treatment. If Pap tests have been discontinued, your risk factors (such as having a new sexual partner) need to be reassessed to determine if screening should resume. Some women have medical problems that increase the chance of getting cervical cancer. In these cases, your health care provider may recommend more frequent screening and Pap tests.  Colorectal Cancer  This type of cancer can be detected and often prevented.  Routine colorectal cancer screening usually begins at 46 years of age and continues through 46 years of age.  Your health care provider may recommend screening at an earlier age if you have risk factors for colon cancer.  Your health care provider may also recommend using home test kits to check for hidden blood  in the stool.  A small camera at the end of a tube can be used to examine your colon directly (sigmoidoscopy or colonoscopy). This is done to check for the earliest forms of colorectal cancer.  Routine screening usually begins at age 32.  Direct examination of the colon should be repeated every 5-10 years through 46 years of age. However, you may need to be screened more often if early forms of precancerous polyps or small growths are found.  Skin Cancer  Check your skin from head to toe regularly.  Tell your health care provider about any new moles or changes in moles, especially if there is a change in a mole's shape or color.  Also tell your health care provider if you have a mole that is larger than the size of a pencil eraser.  Always use sunscreen. Apply sunscreen liberally and repeatedly throughout the day.  Protect yourself by wearing long sleeves, pants, a wide-brimmed hat, and sunglasses whenever you are outside.  Heart disease, diabetes, and high blood pressure  High blood pressure causes heart disease and increases the risk of stroke. High blood pressure is more likely to develop in: ? People who have blood pressure in the high end of the normal range (130-139/85-89 mm Hg). ? People who are overweight or obese. ? People who are African American.  If you are 28-75 years of age, have your blood pressure checked every 3-5 years. If you are 61 years of age or older, have your blood pressure checked every year. You should have your blood pressure measured twice-once when you are at a hospital or clinic, and once when you are not at a hospital or clinic. Record the average of the two measurements. To check your blood pressure when you are not at a hospital or clinic, you can use: ? An automated blood pressure machine at a pharmacy. ? A home blood pressure monitor.  If you are between 45 years and 3 years old, ask your health care provider if you should take aspirin to prevent  strokes.  Have regular diabetes screenings. This involves taking a blood sample to check your fasting blood sugar level. ? If you are at a normal weight and have a low risk for diabetes, have this test once every three years after 46 years of age. ? If you are overweight and have a high risk for diabetes, consider being tested at a younger age or more often. Preventing infection Hepatitis B  If you have a higher risk for hepatitis B, you should be screened for this virus. You are considered at high risk for hepatitis B if: ? You were born in a country where hepatitis B is common. Ask your health care provider which countries are considered high risk. ? Your parents were born in a high-risk country, and you have not been immunized against hepatitis B (hepatitis B vaccine). ? You have HIV or AIDS. ? You use needles to inject street drugs. ? You live with someone who has hepatitis B. ? You have had sex with someone who has hepatitis B. ? You get hemodialysis treatment. ? You take certain medicines for conditions, including cancer, organ transplantation, and autoimmune conditions.  Hepatitis C  Blood testing is recommended for: ? Everyone born from [birth year] through 1964. ? Anyone with known risk factors for hepatitis C.  Sexually transmitted infections (STIs)  You should be screened for sexually transmitted infections (STIs) including gonorrhea and chlamydia if: ? You are sexually active and are younger than 46 years of age. ? You are older than 46 years of age and your health care provider tells you that you are at risk for this type of infection. ? Your sexual activity has changed since you were last screened and you are at an increased risk for chlamydia or gonorrhea. Ask your health care provider if you are at risk.  If you do not have HIV, but are at risk, it may be recommended that you take a prescription medicine daily to prevent HIV infection. This is called pre-exposure prophylaxis  (PrEP). You are considered at risk if: ? You are sexually active and do not regularly use condoms or know the HIV status of your partner(s). ? You take drugs by injection. ? You are sexually active with a partner who has HIV.  Talk with your health care provider about whether you are at high risk of being infected with HIV. If you choose to begin PrEP, you should first be tested for HIV.  You should then be tested every 3 months for as long as you are taking PrEP. Pregnancy  If you are premenopausal and you may become pregnant, ask your health care provider about preconception counseling.  If you may become pregnant, take 400 to 800 micrograms (mcg) of folic acid every day.  If you want to prevent pregnancy, talk to your health care provider about birth control (contraception). Osteoporosis and menopause  Osteoporosis is a disease in which the bones lose minerals and strength with aging. This can result in serious bone fractures. Your risk for osteoporosis can be identified using a bone density scan.  If you are 10 years of age or older, or if you are at risk for osteoporosis and fractures, ask your health care provider if you should be screened.  Ask your health care provider whether you should take a calcium or vitamin D supplement to lower your risk for osteoporosis.  Menopause may have certain physical symptoms and risks.  Hormone replacement therapy may reduce some of these symptoms and risks. Talk to your health care provider about whether hormone replacement therapy is right for you. Follow these instructions at home:  Schedule regular health, dental, and eye exams.  Stay current with your immunizations.  Do not use any tobacco products including cigarettes, chewing tobacco, or electronic cigarettes.  If you are pregnant, do not drink alcohol.  If you are breastfeeding, limit how much and how often you drink alcohol.  Limit alcohol intake to no more than 1 drink per day for  nonpregnant women. One drink equals 12 ounces of beer, 5 ounces of wine, or 1 ounces of hard liquor.  Do not use street drugs.  Do not share needles.  Ask your health care provider for help if you need support or information about quitting drugs.  Tell your health care provider if you often feel depressed.  Tell your health care provider if you have ever been abused or do not feel safe at home. This information is not intended to replace advice given to you by your health care provider. Make sure you discuss any questions you have with your health care provider. Document Released: 10/08/2010 Document Revised: 08/31/2015 Document Reviewed: 12/27/2014 Elsevier Interactive Patient Education  [employer].

## 2017-05-02 NOTE — Progress Notes (Signed)
Subjective:    Patient ID: Jennifer Cook, female    DOB: 25-Dec-1970, 47 y.o.   MRN: [id number]  Patient presents today for complete physical HPI  Denies any acute complaint.  Immunizations: (TDAP, Hep C screen, Pneumovax, Influenza, zoster)  Health Maintenance  Topic Date Due  . Mammogram  09/07/2014  . Pap Smear  07/25/2019  . Tetanus Vaccine  12/26/2024  . Flu Shot  Completed  . HIV Screening  Completed   Diet:regular.  Weight:  Wt Readings from Last 3 Encounters:  05/02/17 161 lb (73 kg)  11/27/15 151 lb 4 oz (68.6 kg)  12/27/14 146 lb (66.2 kg)   Exercise:none at this time, but plan to change that.  Fall Risk: Fall Risk  05/02/2017 11/27/2015 12/27/2014  Falls in the past year? No No No   Home Safety:home with husband and children.  Depression/Suicide: Depression screen [facility] 2/9 05/02/2017 11/27/2015 12/27/2014  Decreased Interest 0 0 0  Down, Depressed, Hopeless 0 0 0  PHQ - 2 Score 0 0 0    Pap Smear (every 24yrs for >21-29 without HPV, every 54yrs for >30-44yrs with HPV):Dr. Posey Cook with [facility], last PAP 2018 (normal per patient).  Mammogram (yearly, >89yrs): report requested,normal per patient  Vision:up to date. Dental:up to date Advanced Directive: has one, needs updating No flowsheet data found.  Medications and allergies reviewed with patient and updated if appropriate.  Patient Active Problem List   Diagnosis Date Noted  . FH: CAD (coronary artery disease) 05/02/2017  . PCP NOTES >>>>>>>>>>>> 11/27/2015  . Annual physical exam 11/29/2013  . Unspecified adverse effect of unspecified drug, medicinal and biological substance 11/27/2011  . HEADACHE 07/29/2007  . RAYNAUD'S SYNDROME 12/23/2006    Current Outpatient Medications on File Prior to Visit  Medication Sig Dispense Refill  . levonorgestrel-ethinyl estradiol (QUASENSE) 0.15-0.03 MG tablet Take 1 tablet by mouth daily.    . NON FORMULARY Topical on face     No current  facility-administered medications on file prior to visit.     Past Medical History:  Diagnosis Date  . Abnormal vaginal Pap smear   . HEADACHE 07/29/2007   Qualifier: Diagnosis of  By: Larose Kells MD, [place]     Past Surgical History:  Procedure Laterality Date  . CESAREAN SECTION  [phone]  . COLPOSCOPY      Social History   Socioeconomic History  . Marital status: Married    Spouse name: None  . Number of children: 2   . Years of education: None  . Highest education level: None  Social Needs  . Financial resource strain: None  . Food insecurity - worry: None  . Food insecurity - inability: None  . Transportation needs - medical: None  . Transportation needs - non-medical: None  Occupational History  . Occupation: Press photographer , Sport and exercise psychologist: [employer]  Tobacco Use  . Smoking status: Never Smoker  . Smokeless tobacco: Never Used  Substance and Sexual Activity  . Alcohol use: Yes    Comment: Rarely  . Drug use: No  . Sexual activity: None  Other Topics Concern  . None  Social History Narrative   Lives w/ husband and 2 boys (69, 29 y/o)        Family History  Problem Relation Age of Onset  . CAD Father        F (~ 36 y/o, MI), GM  . Breast cancer Other        aunt x  2   . Thyroid disease Mother   . Cancer Maternal Aunt 75       Breast  . Cancer Paternal Aunt 48       breast  . Heart disease Paternal Grandmother 58       MI cause of death  . Diabetes Paternal Grandmother   . Colon cancer Neg Hx        Review of Systems  Constitutional: Negative for fever, malaise/fatigue and weight loss.  HENT: Negative for congestion and sore throat.   Eyes:       Negative for visual changes  Respiratory: Negative for cough and shortness of breath.   Cardiovascular: Negative for chest pain, palpitations and leg swelling.  Gastrointestinal: Negative for blood in stool, constipation, diarrhea and heartburn.  Genitourinary: Negative for dysuria, frequency and urgency.    Musculoskeletal: Negative for falls, joint pain and myalgias.  Skin: Negative for rash.  Neurological: Negative for dizziness, sensory change and headaches.  Endo/Heme/Allergies: Does not bruise/bleed easily.  Psychiatric/Behavioral: Negative for depression, substance abuse and suicidal ideas. The patient is not nervous/anxious.     Objective:   Vitals:   05/02/17 1031  BP: 100/64  Pulse: 80  Temp: 97.8 F (36.6 C)  SpO2: 99%    Body mass index is 26.79 kg/m.  ECG: Normal sinus rhythm, no change compared to previous tracing 2015.  Physical Examination:  Physical Exam  Constitutional: She is oriented to person, place, and time and well-developed, well-nourished, and in no distress. No distress.  HENT:  Right Ear: External ear normal.  Left Ear: External ear normal.  Nose: Nose normal.  Mouth/Throat: Oropharynx is clear and moist. No oropharyngeal exudate.  Eyes: Conjunctivae and EOM are normal. Pupils are equal, round, and reactive to light. No scleral icterus.  Neck: Normal range of motion. Neck supple. No thyromegaly present.  Cardiovascular: Normal rate, normal heart sounds and intact distal pulses.  Pulmonary/Chest: Effort normal and breath sounds normal. She exhibits no tenderness.  Abdominal: Soft. Bowel sounds are normal. She exhibits no distension. There is no tenderness.  Genitourinary:  Genitourinary Comments: Pelvic and breast exam deferred to GYN per patient  Musculoskeletal: Normal range of motion. She exhibits no edema or tenderness.  Lymphadenopathy:    She has no cervical adenopathy.  Neurological: She is alert and oriented to person, place, and time. Gait normal.  Skin: Skin is warm and dry.  Psychiatric: Affect and judgment normal.    ASSESSMENT and PLAN:  [name] was seen today for establish care.  Diagnoses and all orders for this visit:  Annual physical exam -     TSH -     Comprehensive metabolic panel -     CBC w/Diff -     Lipid  panel -     EKG 12-Lead  FH: CAD (coronary artery disease) -     EKG 12-Lead  Encounter for lipid screening for cardiovascular disease -     Lipid panel   No problem-specific Assessment & Plan notes found for this encounter.     Follow up: Return if symptoms worsen or fail to improve.  Wilfred Lacy, NP

## 2017-05-13 ENCOUNTER — Ambulatory Visit: Payer: Self-pay

## 2017-05-16 ENCOUNTER — Ambulatory Visit (INDEPENDENT_AMBULATORY_CARE_PROVIDER_SITE_OTHER): Payer: BLUE CROSS/BLUE SHIELD | Admitting: Behavioral Health

## 2017-05-16 DIAGNOSIS — Z111 Encounter for screening for respiratory tuberculosis: Secondary | ICD-10-CM | POA: Diagnosis not present

## 2017-05-16 NOTE — Progress Notes (Signed)
PPD Placement note Jennifer Cook, 47 y.o. female is here today for placement of PPD test Reason for PPD test: Work Pt taken PPD test before: yes Verified in allergy area and with patient that they are not allergic to the products PPD is made of (Phenol or Tween). Yes Is patient taking any oral or IV steroid medication now or have they taken it in the last month? no Has the patient ever received the BCG vaccine?: no Has the patient been in recent contact with anyone known or suspected of having active TB disease?: no      Date of exposure (if applicable): N/A      Name of person they were exposed to (if applicable): N/A Patient's Country of origin?: N/A O: Alert and oriented in NAD. P:  PPD placed on 05/16/2017 at 8:55am.  Patient advised to return for reading within 48-72 hours.

## 2017-05-19 ENCOUNTER — Ambulatory Visit: Payer: BLUE CROSS/BLUE SHIELD

## 2017-05-19 LAB — TB SKIN TEST
Induration: 0 mm
TB SKIN TEST: NEGATIVE

## 2017-07-07 ENCOUNTER — Ambulatory Visit (INDEPENDENT_AMBULATORY_CARE_PROVIDER_SITE_OTHER): Payer: BLUE CROSS/BLUE SHIELD

## 2017-07-07 ENCOUNTER — Encounter: Payer: Self-pay | Admitting: Nurse Practitioner

## 2017-07-07 ENCOUNTER — Ambulatory Visit: Payer: BLUE CROSS/BLUE SHIELD | Admitting: Nurse Practitioner

## 2017-07-07 VITALS — BP 110/76 | HR 76 | Temp 97.9°F | Ht 65.0 in | Wt 160.8 lb

## 2017-07-07 DIAGNOSIS — R05 Cough: Secondary | ICD-10-CM | POA: Diagnosis not present

## 2017-07-07 DIAGNOSIS — J209 Acute bronchitis, unspecified: Secondary | ICD-10-CM | POA: Diagnosis not present

## 2017-07-07 MED ORDER — PREDNISONE 10 MG (21) PO TBPK: ORAL_TABLET | ORAL | 0 refills | Status: DC

## 2017-07-07 MED ORDER — BENZONATATE 100 MG PO CAPS: 100.0000 mg | ORAL_CAPSULE | Freq: Three times a day (TID) | ORAL | 0 refills | Status: DC | PRN

## 2017-07-07 NOTE — Patient Instructions (Addendum)
CXR indicates bronchitis. Oral prednisone dose pack sent.  Refer to ENT if no improvement in 1week.

## 2017-07-07 NOTE — Progress Notes (Signed)
Subjective:  Patient ID: Jennifer Cook, female    DOB: 16-Apr-1970  Age: 47 y.o. MRN: [id number]  CC: Cough (dry unproductive cough started in Feb, tried OTC no relie. Worse when talking, eating/drinking.)  Cough  This is a new problem. The current episode started more than 1 month ago. The problem has been waxing and waning. The problem occurs constantly. The cough is non-productive. Pertinent negatives include no chest pain, chills, fever, heartburn, hemoptysis, myalgias, nasal congestion, postnasal drip, rhinorrhea, shortness of breath, weight loss or wheezing. Exacerbated by: talking, eating or drinking. She has tried OTC cough suppressant (omeprazole and claritin x2weeks) for the symptoms. The treatment provided no relief. Her past medical history is significant for environmental allergies. There is no history of asthma, bronchiectasis, bronchitis, COPD or emphysema.   Dry cough x 49month.  Outpatient Medications Prior to Visit  Medication Sig Dispense Refill  . levonorgestrel-ethinyl estradiol (QUASENSE) 0.15-0.03 MG tablet Take 1 tablet by mouth daily.    . NON FORMULARY Topical on face     No facility-administered medications prior to visit.     ROS See HPI  Objective:  BP 110/76 (BP Location: Right Arm, Patient Position: Sitting, Cuff Size: Normal)   Pulse 76   Temp 97.9 F (36.6 C) (Oral)   Ht 5\' 5"  (1.651 m)   Wt 160 lb 12.8 oz (72.9 kg)   SpO2 98%   BMI 26.76 kg/m   BP Readings from Last 3 Encounters:  07/07/17 110/76  05/02/17 100/64  11/27/15 108/62    Wt Readings from Last 3 Encounters:  07/07/17 160 lb 12.8 oz (72.9 kg)  05/02/17 161 lb (73 kg)  11/27/15 151 lb 4 oz (68.6 kg)    Physical Exam  Constitutional: She is oriented to person, place, and time. No distress.  HENT:  Nose: Nose normal.  Mouth/Throat: No trismus in the jaw. Posterior oropharyngeal erythema present. No oropharyngeal exudate or posterior oropharyngeal edema.  Neck: Normal range  of motion. Neck supple.  Cardiovascular: Normal rate, regular rhythm and normal heart sounds.  Pulmonary/Chest: Effort normal and breath sounds normal.  Musculoskeletal: She exhibits no edema.  Lymphadenopathy:    She has no cervical adenopathy.  Neurological: She is alert and oriented to person, place, and time.  Skin: Skin is warm and dry.  Psychiatric: She has a normal mood and affect.  Vitals reviewed.   Lab Results  Component Value Date   WBC 6.5 05/02/2017   HGB 13.8 05/02/2017   HCT 41.3 05/02/2017   PLT 273.0 05/02/2017   GLUCOSE 83 05/02/2017   CHOL 217 (H) 05/02/2017   TRIG 104.0 05/02/2017   HDL 42.00 05/02/2017   LDLDIRECT 150.2 04/18/2008   LDLCALC 154 (H) 05/02/2017   ALT 12 05/02/2017   AST 14 05/02/2017   NA 136 05/02/2017   K 3.6 05/02/2017   CL 104 05/02/2017   CREATININE 0.75 05/02/2017   BUN 13 05/02/2017   CO2 26 05/02/2017   TSH 1.82 05/02/2017    No results found.  Assessment & Plan:   Jennifer Cook was seen today for cough.  Diagnoses and all orders for this visit:  Acute bronchitis, unspecified organism -     benzonatate (TESSALON) 100 MG capsule; Take 1 capsule (100 mg total) by mouth 3 (three) times daily as needed for cough. -     DG Chest 2 View; Future -     DG Chest 2 View -     predniSONE (STERAPRED UNI-PAK 21 TAB) 10  MG (21) TBPK tablet; As directed on package   I am having Jennifer Cook start on benzonatate and predniSONE. I am also having her maintain her levonorgestrel-ethinyl estradiol and NON FORMULARY.  Meds ordered this encounter  Medications  . benzonatate (TESSALON) 100 MG capsule    Sig: Take 1 capsule (100 mg total) by mouth 3 (three) times daily as needed for cough.    Dispense:  20 capsule    Refill:  0    Order Specific Question:   Supervising Provider    Answer:   Lucille Passy [[id number]]  . predniSONE (STERAPRED UNI-PAK 21 TAB) 10 MG (21) TBPK tablet    Sig: As directed on package    Dispense:  21 tablet     Refill:  0    Order Specific Question:   Supervising Provider    Answer:   Lucille Passy [[id number]]    Follow-up: Return if symptoms worsen or fail to improve.  Wilfred Lacy, NP

## 2018-02-10 ENCOUNTER — Encounter: Payer: Self-pay | Admitting: Nurse Practitioner

## 2018-02-10 DIAGNOSIS — M7541 Impingement syndrome of right shoulder: Secondary | ICD-10-CM | POA: Insufficient documentation

## 2018-04-28 ENCOUNTER — Ambulatory Visit (INDEPENDENT_AMBULATORY_CARE_PROVIDER_SITE_OTHER): Payer: 59 | Admitting: *Deleted

## 2018-04-28 DIAGNOSIS — Z111 Encounter for screening for respiratory tuberculosis: Secondary | ICD-10-CM

## 2018-04-28 NOTE — Progress Notes (Signed)
PPD Placement note Jennifer Cook, 48 y.o. female is here today for placement of PPD test Reason for PPD test: work Pt taken PPD test before: yes Verified in allergy area and with patient that they are not allergic to the products PPD is made of (Phenol or Tween). Yes Is patient taking any oral or IV steroid medication now or have they taken it in the last month? no Has the patient ever received the BCG vaccine?: no Has the patient been in recent contact with anyone known or suspected of having active TB disease?: no      Date of exposure (if applicable): N/A       Name of person they were exposed to (if applicable): N/A Patient's Country of origin?: [place] O: Alert and oriented in NAD. P:  PPD placed on 04/28/2018 on left forearm. Patient advised to return for reading within 48-72 hours.

## 2018-04-30 ENCOUNTER — Ambulatory Visit: Payer: 59 | Admitting: *Deleted

## 2018-04-30 ENCOUNTER — Encounter: Payer: Self-pay | Admitting: *Deleted

## 2018-04-30 DIAGNOSIS — Z111 Encounter for screening for respiratory tuberculosis: Secondary | ICD-10-CM

## 2018-04-30 LAB — TB SKIN TEST
INDURATION: 0 mm
TB SKIN TEST: NEGATIVE

## 2018-04-30 NOTE — Progress Notes (Signed)
PPD Reading Note  PPD read and results entered in EpicCare.  Result: 00 mm induration.  Interpretation: Negative  If test not read within 48-72 hours of initial placement, patient advised to repeat in other arm 1-3 weeks after this test.  Allergic reaction: no

## 2019-06-02 ENCOUNTER — Telehealth: Payer: Self-pay | Admitting: Nurse Practitioner

## 2019-06-02 NOTE — Telephone Encounter (Signed)
[place] for skin test Also advice she is overdue on CPE appt

## 2019-06-02 NOTE — Telephone Encounter (Signed)
Patient calling to request a TB skin test. Please advise.

## 2019-06-02 NOTE — Telephone Encounter (Signed)
Pt request TB placed for work--she has to have it update every year.   Please advise, ok to schedule?

## 2019-06-03 NOTE — Telephone Encounter (Signed)
Pt scheduled for CPE--with TB place on 06/16/2019.

## 2019-06-15 ENCOUNTER — Telehealth: Payer: Self-pay | Admitting: Nurse Practitioner

## 2019-06-15 ENCOUNTER — Other Ambulatory Visit: Payer: Self-pay

## 2019-06-15 NOTE — Telephone Encounter (Signed)
Patient is scheduled here for a TB test tomorrow and then is getting the Covid vaccine on Thursday. She wants to know if there will there be any contraindication or reaction.

## 2019-06-15 NOTE — Telephone Encounter (Signed)
Pt notified and understood there isn't any contraindication or reaction.

## 2019-06-15 NOTE — Telephone Encounter (Signed)
No contraindication. 

## 2019-06-16 ENCOUNTER — Ambulatory Visit (INDEPENDENT_AMBULATORY_CARE_PROVIDER_SITE_OTHER): Payer: 59 | Admitting: Nurse Practitioner

## 2019-06-16 ENCOUNTER — Encounter: Payer: Self-pay | Admitting: Nurse Practitioner

## 2019-06-16 VITALS — BP 112/80 | HR 73 | Temp 96.4°F | Ht 65.0 in | Wt 156.6 lb

## 2019-06-16 DIAGNOSIS — Z111 Encounter for screening for respiratory tuberculosis: Secondary | ICD-10-CM

## 2019-06-16 DIAGNOSIS — Z8249 Family history of ischemic heart disease and other diseases of the circulatory system: Secondary | ICD-10-CM

## 2019-06-16 DIAGNOSIS — Z13220 Encounter for screening for lipoid disorders: Secondary | ICD-10-CM

## 2019-06-16 DIAGNOSIS — Z136 Encounter for screening for cardiovascular disorders: Secondary | ICD-10-CM | POA: Diagnosis not present

## 2019-06-16 DIAGNOSIS — Z0000 Encounter for general adult medical examination without abnormal findings: Secondary | ICD-10-CM | POA: Diagnosis not present

## 2019-06-16 DIAGNOSIS — E782 Mixed hyperlipidemia: Secondary | ICD-10-CM

## 2019-06-16 DIAGNOSIS — Z803 Family history of malignant neoplasm of breast: Secondary | ICD-10-CM | POA: Insufficient documentation

## 2019-06-16 LAB — CBC
HCT: 42 % (ref 36.0–46.0)
Hemoglobin: 14.2 g/dL (ref 12.0–15.0)
MCHC: 33.7 g/dL (ref 30.0–36.0)
MCV: 92.6 fl (ref 78.0–100.0)
Platelets: 269 10*3/uL (ref 150.0–400.0)
RBC: 4.53 Mil/uL (ref 3.87–5.11)
RDW: 13.2 % (ref 11.5–15.5)
WBC: 5.9 10*3/uL (ref 4.0–10.5)

## 2019-06-16 LAB — COMPREHENSIVE METABOLIC PANEL
ALT: 21 U/L (ref 0–35)
AST: 19 U/L (ref 0–37)
Albumin: 4.1 g/dL (ref 3.5–5.2)
Alkaline Phosphatase: 40 U/L (ref 39–117)
BUN: 13 mg/dL (ref 6–23)
CO2: 25 mEq/L (ref 19–32)
Calcium: 9.2 mg/dL (ref 8.4–10.5)
Chloride: 107 mEq/L (ref 96–112)
Creatinine, Ser: 0.87 mg/dL (ref 0.40–1.20)
GFR: 69.57 mL/min (ref >60.00)
Glucose, Bld: 78 mg/dL (ref 70–99)
Potassium: 4.2 mEq/L (ref 3.5–5.1)
Sodium: 139 mEq/L (ref 135–145)
Total Bilirubin: 0.6 mg/dL (ref 0.2–1.2)
Total Protein: 7.1 g/dL (ref 6.0–8.3)

## 2019-06-16 LAB — LIPID PANEL
Cholesterol: 223 mg/dL — ABNORMAL HIGH (ref 0–200)
HDL: 42.8 mg/dL (ref >39.00)
LDL Cholesterol: 151 mg/dL — ABNORMAL HIGH (ref 0–99)
NonHDL: 180.14
Total CHOL/HDL Ratio: 5
Triglycerides: 147 mg/dL (ref 0.0–149.0)
VLDL: 29.4 mg/dL (ref 0.0–40.0)

## 2019-06-16 NOTE — Progress Notes (Addendum)
Subjective:    Patient ID: Jennifer Cook, female    DOB: June 25, 1970, 49 y.o.   MRN: [id number]  Patient presents today for complete physical   HPI Denies any acute complaints, needs quantiferon test done today for her employer. Denies any previous positive PPD or known exposure to TB.  Get annual skin exam by dermatology.  Sexual History (orientation,birth control, marital status, STD):married, current use of OCP, up to date with mammogram and PAP, done by GYN.   Depression/Suicide: Depression screen [facility] 2/9 06/16/2019 05/02/2017 11/27/2015 12/27/2014  Decreased Interest 0 0 0 0  Down, Depressed, Hopeless 0 0 0 0  PHQ - 2 Score 0 0 0 0   Vision:up to date  Dental:up to date  Immunizations: (TDAP, Hep C screen, Pneumovax, Influenza, zoster)  Health Maintenance  Topic Date Due  . Mammogram  09/07/2014  . Pap Smear  07/25/2019  . Tetanus Vaccine  12/26/2024  . Flu Shot  Completed  . HIV Screening  Completed   Diet:regular.  Weight:  Wt Readings from Last 3 Encounters:  06/16/19 156 lb 9.6 oz (71 kg)  07/07/17 160 lb 12.8 oz (72.9 kg)  05/02/17 161 lb (73 kg)    Exercise:walking  Fall Risk: Fall Risk  06/16/2019 05/02/2017 11/27/2015 12/27/2014  Falls in the past year? 0 No No No  Number falls in past yr: 0 - - -  Injury with Fall? 0 - - -   Medications and allergies reviewed with patient and updated if appropriate.  Patient Active Problem List   Diagnosis Date Noted  . Mixed hyperlipidemia 06/17/2019  . FH: breast cancer 06/16/2019  . Shoulder impingement syndrome, right 02/10/2018  . FH: CAD (coronary artery disease) 05/02/2017  . HEADACHE 07/29/2007  . RAYNAUD'S SYNDROME 12/23/2006    Current Outpatient Medications on File Prior to Visit  Medication Sig Dispense Refill  . levonorgestrel-ethinyl estradiol (SEASONALE) 0.15-0.03 MG tablet TAKE 1 TABLET BY MOUTH EVERY DAY    . NON FORMULARY Topical on face     No current facility-administered medications on  file prior to visit.    Past Medical History:  Diagnosis Date  . Abnormal vaginal Pap smear   . HEADACHE 07/29/2007   Qualifier: Diagnosis of  By: Larose Kells MD, Alda Berthold History of chicken pox     Past Surgical History:  Procedure Laterality Date  . BREAST REDUCTION SURGERY  1996  . CESAREAN SECTION  [phone]  . COLPOSCOPY      Social History   Socioeconomic History  . Marital status: Married    Spouse name: Not on file  . Number of children: 2   . Years of education: Not on file  . Highest education level: Not on file  Occupational History  . Occupation: Press photographer , Sport and exercise psychologist: [employer]  Tobacco Use  . Smoking status: Never Smoker  . Smokeless tobacco: Never Used  Substance and Sexual Activity  . Alcohol use: Yes    Comment: Rarely  . Drug use: No  . Sexual activity: Yes    Birth control/protection: Pill  Other Topics Concern  . Not on file  Social History Narrative   Lives w/ husband and 2 boys (66, 83 y/o)       Social Determinants of Radio broadcast assistant Strain:   . Difficulty of Paying Living Expenses:   Food Insecurity:   . Worried About Charity fundraiser in the Last Year:   . [employer]  of Food in the Last Year:   Transportation Needs:   . Film/video editor (Medical):   Marland Kitchen Lack of Transportation (Non-Medical):   Physical Activity:   . Days of Exercise per Week:   . Minutes of Exercise per Session:   Stress:   . Feeling of Stress :   Social Connections:   . Frequency of Communication with Friends and Family:   . Frequency of Social Gatherings with Friends and Family:   . Attends Religious Services:   . Active Member of Clubs or Organizations:   . Attends Archivist Meetings:   Marland Kitchen Marital Status:     Family History  Problem Relation Age of Onset  . CAD Father        F (~ 81 y/o, MI), GM  . Heart attack Father   . Breast cancer Other        aunt x 2   . Thyroid disease Mother   . Hypertension Mother   . Cancer Maternal  Aunt 29       Breast  . Cancer Paternal Aunt 28       breast  . Heart disease Paternal Grandmother 49       MI cause of death  . Diabetes Paternal Grandmother   . Stroke Maternal Grandmother   . Alcohol abuse Maternal Grandfather   . Cancer Maternal Grandfather   . Stroke Paternal Grandfather   . Colon cancer Neg Hx         Review of Systems  Constitutional: Negative for fever, malaise/fatigue and weight loss.  HENT: Negative for congestion and sore throat.   Eyes:       Negative for visual changes  Respiratory: Negative for cough and shortness of breath.   Cardiovascular: Negative for chest pain, palpitations and leg swelling.  Gastrointestinal: Negative for blood in stool, constipation, diarrhea and heartburn.  Genitourinary: Negative for dysuria, frequency and urgency.  Musculoskeletal: Negative for falls, joint pain and myalgias.  Skin: Negative for rash.  Neurological: Negative for dizziness, sensory change and headaches.  Endo/Heme/Allergies: Does not bruise/bleed easily.  Psychiatric/Behavioral: Negative for depression, substance abuse and suicidal ideas. The patient is not nervous/anxious.     Objective:   Vitals:   06/16/19 0924  BP: 112/80  Pulse: 73  Temp: (!) 96.4 F (35.8 C)  SpO2: 99%    Body mass index is 26.06 kg/m.   Physical Examination:  Physical Exam Vitals and nursing note reviewed.  Constitutional:      General: She is not in acute distress.    Appearance: She is well-developed.  HENT:     Right Ear: Tympanic membrane, ear canal and external ear normal.     Left Ear: Tympanic membrane, ear canal and external ear normal.  Eyes:     Extraocular Movements: Extraocular movements intact.     Conjunctiva/sclera: Conjunctivae normal.  Cardiovascular:     Rate and Rhythm: Normal rate and regular rhythm.     Heart sounds: Normal heart sounds.  Pulmonary:     Effort: Pulmonary effort is normal. No respiratory distress.     Breath sounds:  Normal breath sounds.  Chest:     Chest wall: No tenderness.  Abdominal:     General: Bowel sounds are normal.     Palpations: Abdomen is soft.  Genitourinary:    Comments: Deferred breast and pelvic exam to GYN-Dr. Posey Pronto Musculoskeletal:        General: Normal range of motion.     Cervical  back: Normal range of motion and neck supple.     Right lower leg: No edema.     Left lower leg: No edema.  Lymphadenopathy:     Cervical: No cervical adenopathy.  Skin:    General: Skin is dry.     Findings: No rash.  Neurological:     Mental Status: She is alert and oriented to person, place, and time.     Deep Tendon Reflexes: Reflexes are normal and symmetric.  Psychiatric:        Mood and Affect: Mood normal.        Behavior: Behavior normal.        Thought Content: Thought content normal.    ASSESSMENT and PLAN: This visit occurred during the SARS-CoV-2 public health emergency.  Safety protocols were in place, including screening questions prior to the visit, additional usage of staff PPE, and extensive cleaning of exam room while observing appropriate contact time as indicated for disinfecting solutions.   Brandyn was seen today for annual exam.  Diagnoses and all orders for this visit:  Preventative health care -     CBC -     Comprehensive metabolic panel -     Lipid panel  Mixed hyperlipidemia -     CRP High sensitivity; Future -     Lipid panel; Future  Screening-pulmonary TB -     QuantiFERON-TB Gold Plus  FH: CAD (coronary artery disease) -     CRP High sensitivity; Future -     Lipid panel; Future    Mixed hyperlipidemia Normal CMP and TSH. Persistent abnormal lipid panel: elevated TC and LDL. With your family history of premature CAD/MI; we need to also check CRP during your next lab appt in 78month (fasting). In the mean time, maintain DASH diet and daily exercise.  Schedule lab appt in 90months.       Problem List Items Addressed This Visit      Other    FH: CAD (coronary artery disease)   Relevant Orders   CRP High sensitivity   Lipid panel   Mixed hyperlipidemia    Normal CMP and TSH. Persistent abnormal lipid panel: elevated TC and LDL. With your family history of premature CAD/MI; we need to also check CRP during your next lab appt in 57month (fasting). In the mean time, maintain DASH diet and daily exercise.  Schedule lab appt in 78months.        Relevant Orders   CRP High sensitivity   Lipid panel    Other Visit Diagnoses    Preventative health care    -  Primary   Relevant Orders   CBC (Completed)   Comprehensive metabolic panel (Completed)   Lipid panel (Completed)   Screening-pulmonary TB       Relevant Orders   QuantiFERON-TB Gold Plus      Follow up: Return in about 1 year (around 06/15/2020) for CPE (fasting).  Wilfred Lacy, NP

## 2019-06-16 NOTE — Patient Instructions (Addendum)
Normal CMP and cbc Persistent abnormal lipid panel: elevated TC and LDL. With your family history of premature CAD/MI; we need to also check CRP during your next lab appt in 84month(fasting). In the mean time, maintain DASH diet and daily exercise.  Schedule lab appt in 618month Pending quantiferon  Please have [facility] and GYN office fax report of mammogram and last PAP respectively.   Preventive Care [id number] Old, Female Preventive care refers to visits with your health care provider and lifestyle choices that can promote health and wellness. This includes:  A yearly physical exam. This may also be called an annual well check.  Regular dental visits and eye exams.  Immunizations.  Screening for certain conditions.  Healthy lifestyle choices, such as eating a healthy diet, getting regular exercise, not using drugs or products that contain nicotine and tobacco, and limiting alcohol use. What can I expect for my preventive care visit? Physical exam Your health care provider will check your:  Height and weight. This may be used to calculate body mass index (BMI), which tells if you are at a healthy weight.  Heart rate and blood pressure.  Skin for abnormal spots. Counseling Your health care provider may ask you questions about your:  Alcohol, tobacco, and drug use.  Emotional well-being.  Home and relationship well-being.  Sexual activity.  Eating habits.  Work and work enStatistician Method of birth control.  Menstrual cycle.  Pregnancy history. What immunizations do I need?  Influenza (flu) vaccine  This is recommended every year. Tetanus, diphtheria, and pertussis (Tdap) vaccine  You may need a Td booster every 10 years. Varicella (chickenpox) vaccine  You may need this if you have not been vaccinated. Zoster (shingles) vaccine  You may need this after age 1924Measles, mumps, and rubella (MMR) vaccine  You may need at least one dose of MMR if you were  born in 1957 or later. You may also need a second dose. Pneumococcal conjugate (PCV13) vaccine  You may need this if you have certain conditions and were not previously vaccinated. Pneumococcal polysaccharide (PPSV23) vaccine  You may need one or two doses if you smoke cigarettes or if you have certain conditions. Meningococcal conjugate (MenACWY) vaccine  You may need this if you have certain conditions. Hepatitis A vaccine  You may need this if you have certain conditions or if you travel or work in places where you may be exposed to hepatitis A. Hepatitis B vaccine  You may need this if you have certain conditions or if you travel or work in places where you may be exposed to hepatitis B. Haemophilus influenzae type b (Hib) vaccine  You may need this if you have certain conditions. Human papillomavirus (HPV) vaccine  If recommended by your health care provider, you may need three doses over 6 months. You may receive vaccines as individual doses or as more than one vaccine together in one shot (combination vaccines). Talk with your health care provider about the risks and benefits of combination vaccines. What tests do I need? Blood tests  Lipid and cholesterol levels. These may be checked every 5 years, or more frequently if you are over 5036ears old.  Hepatitis C test.  Hepatitis B test. Screening  Lung cancer screening. You may have this screening every year starting at age 5747f you have a 30-pack-year history of smoking and currently smoke or have quit within the past 15 years.  Colorectal cancer screening. All adults should have this  screening starting at age 86 and continuing until age 37. Your health care provider may recommend screening at age 43 if you are at increased risk. You will have tests every 1-10 years, depending on your results and the type of screening test.  Diabetes screening. This is done by checking your blood sugar (glucose) after you have not eaten  for a while (fasting). You may have this done every 1-3 years.  Mammogram. This may be done every 1-2 years. Talk with your health care provider about when you should start having regular mammograms. This may depend on whether you have a family history of breast cancer.  BRCA-related cancer screening. This may be done if you have a family history of breast, ovarian, tubal, or peritoneal cancers.  Pelvic exam and Pap test. This may be done every 3 years starting at age 93. Starting at age 62, this may be done every 5 years if you have a Pap test in combination with an HPV test. Other tests  Sexually transmitted disease (STD) testing.  Bone density scan. This is done to screen for osteoporosis. You may have this scan if you are at high risk for osteoporosis. Follow these instructions at home: Eating and drinking  Eat a diet that includes fresh fruits and vegetables, whole grains, lean protein, and low-fat dairy.  Take vitamin and mineral supplements as recommended by your health care provider.  Do not drink alcohol if: ? Your health care provider tells you not to drink. ? You are pregnant, may be pregnant, or are planning to become pregnant.  If you drink alcohol: ? Limit how much you have to 0-1 drink a day. ? Be aware of how much alcohol is in your drink. In the [place] one drink equals one 12 oz bottle of beer (355 mL), one 5 oz glass of wine (148 mL), or one 1 oz glass of hard liquor (44 mL). Lifestyle  Take daily care of your teeth and gums.  Stay active. Exercise for at least 30 minutes on 5 or more days each week.  Do not use any products that contain nicotine or tobacco, such as cigarettes, e-cigarettes, and chewing tobacco. If you need help quitting, ask your health care provider.  If you are sexually active, practice safe sex. Use a condom or other form of birth control (contraception) in order to prevent pregnancy and STIs (sexually transmitted infections).  If told by  your health care provider, take low-dose aspirin daily starting at age 58. What's next?  Visit your health care provider once a year for a well check visit.  Ask your health care provider how often you should have your eyes and teeth checked.  Stay up to date on all vaccines. This information is not intended to replace advice given to you by your health care provider. Make sure you discuss any questions you have with your health care provider. Document Revised: 12/04/2017 Document Reviewed: 12/04/2017 Elsevier Patient Education  2020 [employer].

## 2019-06-17 DIAGNOSIS — E782 Mixed hyperlipidemia: Secondary | ICD-10-CM | POA: Insufficient documentation

## 2019-06-17 NOTE — Addendum Note (Signed)
Addended by: Wilfred Lacy L on: 06/17/2019 09:40 AM   Modules accepted: Orders

## 2019-06-17 NOTE — Assessment & Plan Note (Signed)
Normal CMP and TSH. Persistent abnormal lipid panel: elevated TC and LDL. With your family history of premature CAD/MI; we need to also check CRP during your next lab appt in 67month (fasting). In the mean time, maintain DASH diet and daily exercise.  Schedule lab appt in 46months.

## 2019-06-18 LAB — QUANTIFERON-TB GOLD PLUS
Mitogen-NIL: >10 IU/mL
NIL: 0.03 IU/mL
QuantiFERON-TB Gold Plus: NEGATIVE
TB1-NIL: 0 IU/mL
TB2-NIL: 0.01 IU/mL

## 2020-04-13 ENCOUNTER — Ambulatory Visit (INDEPENDENT_AMBULATORY_CARE_PROVIDER_SITE_OTHER): Payer: 59

## 2020-04-13 ENCOUNTER — Other Ambulatory Visit: Payer: Self-pay

## 2020-04-13 ENCOUNTER — Ambulatory Visit (INDEPENDENT_AMBULATORY_CARE_PROVIDER_SITE_OTHER): Payer: 59 | Admitting: Nurse Practitioner

## 2020-04-13 ENCOUNTER — Encounter: Payer: Self-pay | Admitting: Nurse Practitioner

## 2020-04-13 VITALS — BP 112/64 | HR 82 | Temp 97.3°F | Wt 161.0 lb

## 2020-04-13 DIAGNOSIS — S6992XA Unspecified injury of left wrist, hand and finger(s), initial encounter: Secondary | ICD-10-CM | POA: Diagnosis not present

## 2020-04-13 NOTE — Patient Instructions (Signed)
Go to lab for hand x-ray Continue cold compress and elevation

## 2020-04-13 NOTE — Progress Notes (Signed)
   Subjective:  Patient ID: Jennifer Cook, female    DOB: 1970/11/23  Age: 50 y.o. MRN: [id number]  CC: Acute Visit (Pt states she smashed her hand up against a fence yesterday resulting in left had swelling and a small puncture wound. Tdap is current and up to date. )  Hand Pain  The incident occurred 12 to 24 hours ago. The incident occurred at home. The injury mechanism was a direct blow. The pain is present in the left hand. The quality of the pain is described as aching. The pain does not radiate. The pain is moderate. The pain has been constant since the incident. Pertinent negatives include no chest pain, muscle weakness, numbness or tingling. The symptoms are aggravated by movement, lifting and palpation. She has tried elevation for the symptoms. The treatment provided mild relief.   Reviewed past Medical, Social and Family history today.  Outpatient Medications Prior to Visit  Medication Sig Dispense Refill  . levonorgestrel-ethinyl estradiol (SEASONALE) 0.15-0.03 MG tablet TAKE 1 TABLET BY MOUTH EVERY DAY    . NON FORMULARY Topical on face (Patient not taking: Reported on 04/13/2020)     No facility-administered medications prior to visit.    ROS See HPI  Objective:  BP 112/64 (BP Location: Left Arm, Patient Position: Sitting, Cuff Size: Normal)   Pulse 82   Temp (!) 97.3 F (36.3 C) (Temporal)   Wt 161 lb (73 kg)   SpO2 99%   BMI 26.79 kg/m   Physical Exam Vitals reviewed.  Musculoskeletal:        General: Swelling, tenderness and signs of injury present.     Right hand: Normal.     Left hand: Swelling, laceration, tenderness and bony tenderness present. No deformity. Decreased range of motion. Normal capillary refill. Normal pulse.  Skin:    Findings: Bruising present.  Neurological:     Mental Status: She is alert.     Assessment & Plan:  This visit occurred during the SARS-CoV-2 public health emergency.  Safety protocols were in place, including screening  questions prior to the visit, additional usage of staff PPE, and extensive cleaning of exam room while observing appropriate contact time as indicated for disinfecting solutions.   Jennifer Cook was seen today for acute visit.  Diagnoses and all orders for this visit:  Hand injury, left, initial encounter -     DG Hand Complete Left; Future -     DG Hand Complete Left  X-ray negative for fracture of foreign object. Continue elevation and cold compress.  Problem List Items Addressed This Visit   None   Visit Diagnoses    Hand injury, left, initial encounter    -  Primary   Relevant Orders   DG Hand Complete Left (Completed)      Follow-up: No follow-ups on file.  Alysia Penna, NP

## 2020-04-14 ENCOUNTER — Encounter: Payer: Self-pay | Admitting: Nurse Practitioner

## 2020-04-21 LAB — HM MAMMOGRAPHY

## 2020-07-24 ENCOUNTER — Telehealth: Payer: Self-pay | Admitting: Nurse Practitioner

## 2020-07-24 NOTE — Telephone Encounter (Signed)
Patient is calling to get orders placed for a TB test. She said that she needs this done for work. Please let me know when orders have been entered and I will call patient to schedule a nurse visit.   Patients contact info: [phone]

## 2020-07-28 NOTE — Telephone Encounter (Signed)
Appointment scheduled, pt was prescreened.

## 2020-07-28 NOTE — Telephone Encounter (Signed)
Pt is calling back regarding orders for a TB test. She is going to need this done asap or she will not be allowed to work Please advise Thanks!

## 2020-07-28 NOTE — Telephone Encounter (Signed)
Ok to schedule. Complete questionnaire per protocol

## 2020-07-31 ENCOUNTER — Ambulatory Visit (INDEPENDENT_AMBULATORY_CARE_PROVIDER_SITE_OTHER): Payer: 59

## 2020-07-31 ENCOUNTER — Other Ambulatory Visit: Payer: Self-pay

## 2020-07-31 ENCOUNTER — Other Ambulatory Visit: Payer: 59

## 2020-07-31 DIAGNOSIS — Z0000 Encounter for general adult medical examination without abnormal findings: Secondary | ICD-10-CM | POA: Diagnosis not present

## 2020-08-01 NOTE — Progress Notes (Signed)
Per orders of [facility] pt is here for labs, pt tolerated draw well.

## 2020-08-02 ENCOUNTER — Other Ambulatory Visit: Payer: Self-pay | Admitting: Nurse Practitioner

## 2020-08-02 ENCOUNTER — Encounter: Payer: Self-pay | Admitting: Nurse Practitioner

## 2020-08-02 LAB — QUANTIFERON-TB GOLD PLUS
Mitogen-NIL: >10 IU/mL
NIL: 0.02 IU/mL
QuantiFERON-TB Gold Plus: NEGATIVE
TB1-NIL: 0 IU/mL
TB2-NIL: 0 IU/mL

## 2020-12-19 DIAGNOSIS — M2011 Hallux valgus (acquired), right foot: Secondary | ICD-10-CM | POA: Insufficient documentation

## 2021-01-22 ENCOUNTER — Ambulatory Visit (INDEPENDENT_AMBULATORY_CARE_PROVIDER_SITE_OTHER): Payer: 59 | Admitting: Nurse Practitioner

## 2021-01-22 ENCOUNTER — Encounter: Payer: Self-pay | Admitting: Nurse Practitioner

## 2021-01-22 ENCOUNTER — Other Ambulatory Visit: Payer: Self-pay

## 2021-01-22 VITALS — BP 114/76 | HR 73 | Temp 96.3°F | Ht 64.25 in | Wt 156.8 lb

## 2021-01-22 DIAGNOSIS — Z23 Encounter for immunization: Secondary | ICD-10-CM

## 2021-01-22 DIAGNOSIS — Z1211 Encounter for screening for malignant neoplasm of colon: Secondary | ICD-10-CM | POA: Diagnosis not present

## 2021-01-22 DIAGNOSIS — E782 Mixed hyperlipidemia: Secondary | ICD-10-CM | POA: Diagnosis not present

## 2021-01-22 DIAGNOSIS — Z8249 Family history of ischemic heart disease and other diseases of the circulatory system: Secondary | ICD-10-CM | POA: Diagnosis not present

## 2021-01-22 DIAGNOSIS — Z8371 Family history of colonic polyps: Secondary | ICD-10-CM | POA: Insufficient documentation

## 2021-01-22 DIAGNOSIS — Z83719 Family history of colon polyps, unspecified: Secondary | ICD-10-CM | POA: Insufficient documentation

## 2021-01-22 DIAGNOSIS — Z0000 Encounter for general adult medical examination without abnormal findings: Secondary | ICD-10-CM | POA: Diagnosis not present

## 2021-01-22 LAB — CBC
HCT: 41.9 % (ref 36.0–46.0)
Hemoglobin: 13.9 g/dL (ref 12.0–15.0)
MCHC: 33.2 g/dL (ref 30.0–36.0)
MCV: 92.9 fl (ref 78.0–100.0)
Platelets: 256 10*3/uL (ref 150.0–400.0)
RBC: 4.51 Mil/uL (ref 3.87–5.11)
RDW: 12.8 % (ref 11.5–15.5)
WBC: 4.8 10*3/uL (ref 4.0–10.5)

## 2021-01-22 LAB — LIPID PANEL
Cholesterol: 210 mg/dL — ABNORMAL HIGH (ref 0–200)
HDL: 49 mg/dL (ref >39.00)
LDL Cholesterol: 142 mg/dL — ABNORMAL HIGH (ref 0–99)
NonHDL: 161.38
Total CHOL/HDL Ratio: 4
Triglycerides: 98 mg/dL (ref 0.0–149.0)
VLDL: 19.6 mg/dL (ref 0.0–40.0)

## 2021-01-22 LAB — COMPREHENSIVE METABOLIC PANEL
ALT: 13 U/L (ref 0–35)
AST: 15 U/L (ref 0–37)
Albumin: 4.1 g/dL (ref 3.5–5.2)
Alkaline Phosphatase: 41 U/L (ref 39–117)
BUN: 14 mg/dL (ref 6–23)
CO2: 25 mEq/L (ref 19–32)
Calcium: 8.8 mg/dL (ref 8.4–10.5)
Chloride: 108 mEq/L (ref 96–112)
Creatinine, Ser: 0.82 mg/dL (ref 0.40–1.20)
GFR: 84.04 mL/min (ref >60.00)
Glucose, Bld: 92 mg/dL (ref 70–99)
Potassium: 4.5 mEq/L (ref 3.5–5.1)
Sodium: 139 mEq/L (ref 135–145)
Total Bilirubin: 0.3 mg/dL (ref 0.2–1.2)
Total Protein: 6.6 g/dL (ref 6.0–8.3)

## 2021-01-22 LAB — TSH: TSH: 2.76 u[IU]/mL (ref 0.35–5.50)

## 2021-01-22 LAB — HIGH SENSITIVITY CRP: CRP, High Sensitivity: 9.12 mg/L — ABNORMAL HIGH (ref 0.000–5.000)

## 2021-01-22 NOTE — Patient Instructions (Addendum)
Sign medical release to get records from Dr. Jerl Santos and [facility]  Go to lab for blood draw.  You will be contacted to schedule appt with GI  Preventing High Cholesterol Cholesterol is a white, waxy substance similar to fat that the human body needs to help build cells. The liver makes all the cholesterol that a person's body needs. Having high cholesterol (hypercholesterolemia) increases your risk for heart disease and stroke. Extra or excess cholesterol comes from the food that you eat. High cholesterol can often be prevented with diet and lifestyle changes. If you already have high cholesterol, you can control it with diet, lifestyle changes, and medicines. How can high cholesterol affect me? If you have high cholesterol, fatty deposits (plaques) may build up on the walls of your blood vessels. The blood vessels that carry blood away from your heart are called arteries. Plaques make the arteries narrower and stiffer. This in turn can: Restrict or block blood flow and cause blood clots to form. Increase your risk for heart attack and stroke. What can increase my risk for high cholesterol? This condition is more likely to develop in people who: Eat foods that are high in saturated fat or cholesterol. Saturated fat is mostly found in foods that come from animal sources. Are overweight. Are not getting enough exercise. Use products that contain nicotine or tobacco, such as cigarettes, e-cigarettes, and chewing tobacco. Have a family history of high cholesterol (familial hypercholesterolemia). What actions can I take to prevent this? Nutrition  Eat less saturated fat. Avoid trans fats (partially hydrogenated oils). These are often found in margarine and in some baked goods, fried foods, and snacks bought in packages. Avoid precooked or cured meat, such as bacon, sausages, or meat loaves. Avoid foods and drinks that have added sugars. Eat more fruits, vegetables, and whole  grains. Choose healthy sources of protein, such as fish, poultry, lean cuts of red meat, beans, peas, lentils, and nuts. Choose healthy sources of fat, such as: Nuts. Vegetable oils, especially olive oil. Fish that have healthy fats, such as omega-3 fatty acids. These fish include mackerel or salmon. Lifestyle Lose weight if you are overweight. Maintaining a healthy body mass index (BMI) can help prevent or control high cholesterol. It can also lower your risk for diabetes and high blood pressure. Ask your health care provider to help you with a diet and exercise plan to lose weight safely. Do not use any products that contain nicotine or tobacco. These products include cigarettes, chewing tobacco, and vaping devices, such as e-cigarettes. If you need help quitting, ask your health care provider. Alcohol use Do not drink alcohol if: Your health care provider tells you not to drink. You are pregnant, may be pregnant, or are planning to become pregnant. If you drink alcohol: Limit how much you have to: 0-1 drink a day for women. 0-2 drinks a day for men. Know how much alcohol is in your drink. In the [place] one drink equals one 12 oz bottle of beer (355 mL), one 5 oz glass of wine (148 mL), or one 1 oz glass of hard liquor (44 mL). Activity  Get enough exercise. Do exercises as told by your health care provider. Each week, do at least 150 minutes of exercise that takes a medium level of effort (moderate-intensity exercise). This kind of exercise: Makes your heart beat faster while allowing you to still be able to talk. Can be done in short sessions several times a day or longer sessions a  few times a week. For example, on 5 days each week, you could walk fast or ride your bike 3 times a day for 10 minutes each time. Medicines Your health care provider may recommend medicines to help lower cholesterol. This may be a medicine to lower the amount of cholesterol that your liver makes. You may need  medicine if: Diet and lifestyle changes have not lowered your cholesterol enough. You have high cholesterol and other risk factors for heart disease or stroke. Take over-the-counter and prescription medicines only as told by your health care provider. General information Manage your risk factors for high cholesterol. Talk with your health care provider about all your risk factors and how to lower your risk. Manage other conditions that you have, such as diabetes or high blood pressure (hypertension). Have blood tests to check your cholesterol levels at regular points in time as told by your health care provider. Keep all follow-up visits. This is important. Where to find more information American Heart Association: www.heart.org National Heart, Lung, and Blood Institute: https://wilson-eaton.com/ Summary High cholesterol increases your risk for heart disease and stroke. By keeping your cholesterol level low, you can reduce your risk for these conditions. High cholesterol can often be prevented with diet and lifestyle changes. Work with your health care provider to manage your risk factors, and have your blood tested regularly. This information is not intended to replace advice given to you by your health care provider. Make sure you discuss any questions you have with your health care provider. Document Revised: 05/29/2020 Document Reviewed: 05/29/2020 Elsevier Patient Education  2022 [employer].

## 2021-01-22 NOTE — Assessment & Plan Note (Addendum)
FHx of premature CAD (father and PGM) and Hyperlipidemia (mother). No tobacco use, no ETOH use Exercise: 3-5x/week Agreed to lipid panel, lipo A and CRP check today

## 2021-01-22 NOTE — Progress Notes (Signed)
Subjective:    Patient ID: Jennifer Cook, female    DOB: [birth year], 50 y.o.   MRN: [id number]  Patient presents today for CPE and eval of chronic conditions  HPI Mixed hyperlipidemia FHx of premature CAD (father and PGM) and Hyperlipidemia (mother). No tobacco use, no ETOH use Exercise: 3-5x/week Agreed to lipid painel, lipo A and CRP check today Fhx of colon polyp.   Vision:up to date Dental:up to date Diet:regular Exercise:cardio (treadmil, jogging) 3-5x/week Weight:  Wt Readings from Last 3 Encounters:  01/22/21 156 lb 12.8 oz (71.1 kg)  04/13/20 161 lb (73 kg)  06/16/19 156 lb 9.6 oz (71 kg)    Sexual History (orientation,birth control, marital status, STD):pelvic exam completed by GYN-Dr. Posey Pronto, up to date with mammogram-completed by [facility] mammography.  Depression/Suicide: Depression screen [facility] 2/9 01/22/2021 06/16/2019 05/02/2017 11/27/2015 12/27/2014  Decreased Interest 0 0 0 0 0  Down, Depressed, Hopeless 0 0 0 0 0  PHQ - 2 Score 0 0 0 0 0  Altered sleeping 1 - - - -  Tired, decreased energy 1 - - - -  Change in appetite 0 - - - -  Feeling bad or failure about yourself  0 - - - -  Trouble concentrating 0 - - - -  Moving slowly or fidgety/restless 0 - - - -  Suicidal thoughts 0 - - - -  PHQ-9 Score 2 - - - -  Difficult doing work/chores Not difficult at all - - - -   Immunizations: (TDAP, Hep C screen, Pneumovax, Influenza, zoster)  Health Maintenance  Topic Date Due  . Hepatitis C Screening: USPSTF Recommendation to screen - Ages 56-79 yo.  Never done  . Mammogram  09/07/2014  . Colon Cancer Screening  Never done  . Pap Smear  07/25/2019  . COVID-19 Vaccine (3 - Booster for Pfizer series) 12/08/2019  . Tetanus Vaccine  12/26/2024  . Flu Shot  Completed  . HIV Screening  Completed  . HPV Vaccine  Aged Out   Fall Risk: Fall Risk  01/22/2021 06/16/2019 05/02/2017 11/27/2015 12/27/2014  Falls in the past year? 0 0 No No No  Number falls in past yr: 0 0 - -  -  Injury with Fall? 0 0 - - -  Risk for fall due to : No Fall Risks - - - -  Follow up Falls evaluation completed - - - -   Medications and allergies reviewed with patient and updated if appropriate.  Patient Active Problem List   Diagnosis Date Noted  . Hallux valgus of right foot 12/19/2020  . Mixed hyperlipidemia 06/17/2019  . FH: breast cancer 06/16/2019  . Shoulder impingement syndrome, right 02/10/2018  . Family history of premature CAD 05/02/2017  . HEADACHE 07/29/2007  . RAYNAUD'S SYNDROME 12/23/2006    Current Outpatient Medications on File Prior to Visit  Medication Sig Dispense Refill  . levonorgestrel-ethinyl estradiol (SEASONALE) 0.15-0.03 MG tablet TAKE 1 TABLET BY MOUTH EVERY DAY    . loratadine (CLARITIN) 10 MG tablet Take 10 mg by mouth daily.    . Multiple Vitamins-Minerals (CENTRUM WOMEN PO) Take by mouth.    . Probiotic Product (PROBIOTIC DAILY PO) Take by mouth.     No current facility-administered medications on file prior to visit.    Past Medical History:  Diagnosis Date  . Abnormal vaginal Pap smear   . HEADACHE 07/29/2007   Qualifier: Diagnosis of  By: Larose Kells MD, [place] History of chicken pox  Past Surgical History:  Procedure Laterality Date  . BREAST REDUCTION SURGERY  1996  . CESAREAN SECTION  [phone]  . COLPOSCOPY      Social History   Socioeconomic History  . Marital status: Married    Spouse name: Not on file  . Number of children: 2   . Years of education: Not on file  . Highest education level: Not on file  Occupational History  . Occupation: Press photographer , Sport and exercise psychologist: [employer]  Tobacco Use  . Smoking status: Never  . Smokeless tobacco: Never  Vaping Use  . Vaping Use: Never used  Substance and Sexual Activity  . Alcohol use: Yes    Comment: Rarely  . Drug use: No  . Sexual activity: Yes    Birth control/protection: Pill  Other Topics Concern  . Not on file  Social History Narrative   Lives w/ husband and 2  boys (17, 65 y/o)       Social Determinants of Radio broadcast assistant Strain: Not on file  Food Insecurity: Not on file  Transportation Needs: Not on file  Physical Activity: Not on file  Stress: Not on file  Social Connections: Not on file    Family History  Problem Relation Age of Onset  . Hyperlipidemia Mother   . Thyroid disease Mother   . Hypertension Mother   . CAD Father        F (~ 61 y/o, MI), GM  . Heart attack Father   . Cancer Maternal Aunt 41       Breast  . Cancer Paternal Aunt 47       breast  . Stroke Maternal Grandmother   . Alcohol abuse Maternal Grandfather   . Cancer Maternal Grandfather   . Heart disease Paternal Grandmother 15       MI cause of death  . Diabetes Paternal Grandmother   . Stroke Paternal Grandfather   . Breast cancer Other        aunt x 2   . Colon cancer Neg Hx         Review of Systems  Constitutional:  Negative for fever, malaise/fatigue and weight loss.  HENT:  Negative for congestion and sore throat.   Eyes:        Negative for visual changes  Respiratory:  Negative for cough and shortness of breath.   Cardiovascular:  Negative for chest pain, palpitations and leg swelling.  Gastrointestinal:  Negative for blood in stool, constipation, diarrhea and heartburn.  Genitourinary:  Negative for dysuria, frequency and urgency.  Musculoskeletal:  Negative for falls, joint pain and myalgias.  Skin:  Negative for rash.  Neurological:  Negative for dizziness, sensory change and headaches.  Endo/Heme/Allergies:  Does not bruise/bleed easily.  Psychiatric/Behavioral:  Negative for depression, substance abuse and suicidal ideas. The patient is not nervous/anxious.    Objective:   Vitals:   01/22/21 0832  BP: 114/76  Pulse: 73  Temp: (!) 96.3 F (35.7 C)  SpO2: 97%   Body mass index is 26.71 kg/m.  Physical Examination:  Physical Exam Vitals reviewed.  Constitutional:      General: She is not in acute distress.     Appearance: She is well-developed.  HENT:     Right Ear: Tympanic membrane, ear canal and external ear normal.     Left Ear: Tympanic membrane, ear canal and external ear normal.     Nose: Nose normal.     Mouth/Throat:  Pharynx: No oropharyngeal exudate.  Eyes:     Extraocular Movements: Extraocular movements intact.     Conjunctiva/sclera: Conjunctivae normal.  Cardiovascular:     Rate and Rhythm: Normal rate and regular rhythm.     Pulses: Normal pulses.     Heart sounds: Normal heart sounds.  Pulmonary:     Effort: Pulmonary effort is normal. No respiratory distress.     Breath sounds: Normal breath sounds.  Chest:     Chest wall: No tenderness.  Abdominal:     General: Bowel sounds are normal.     Palpations: Abdomen is soft.  Genitourinary:    Comments: Deferred breast and pelvic exam to GYN Musculoskeletal:        General: Normal range of motion.     Cervical back: Normal range of motion and neck supple.     Right lower leg: No edema.     Left lower leg: No edema.  Skin:    General: Skin is warm and dry.  Neurological:     Mental Status: She is alert and oriented to person, place, and time.     Deep Tendon Reflexes: Reflexes are normal and symmetric.  Psychiatric:        Mood and Affect: Mood normal.        Behavior: Behavior normal.        Thought Content: Thought content normal.   ASSESSMENT and PLAN: This visit occurred during the SARS-CoV-2 public health emergency.  Safety protocols were in place, including screening questions prior to the visit, additional usage of staff PPE, and extensive cleaning of exam room while observing appropriate contact time as indicated for disinfecting solutions.   Mariya was seen today for annual exam.  Diagnoses and all orders for this visit:  Preventative health care -     Comprehensive metabolic panel -     TSH -     CBC -     Ambulatory referral to Gastroenterology  Mixed hyperlipidemia -     Lipid panel -      CRP High sensitivity -     Lipoprotein A (LPA)  Family history of premature CAD -     Lipid panel -     CRP High sensitivity -     Lipoprotein A (LPA)  Colon cancer screening -     Ambulatory referral to Gastroenterology  Flu vaccine need -     Flu Vaccine QUAD 6+ mos PF IM (Fluarix Quad PF)  Sign medical release to get records from Dr. Jerl Santos and [facility]. Continue daily exercise and maintain low fat/low carb diet    Problem List Items Addressed This Visit       Other   Family history of premature CAD   Relevant Orders   Lipid panel   CRP High sensitivity   Lipoprotein A (LPA)   Mixed hyperlipidemia    FHx of premature CAD (father and PGM) and Hyperlipidemia (mother). No tobacco use, no ETOH use Exercise: 3-5x/week Agreed to lipid painel, lipo A and CRP check today      Relevant Orders   Lipid panel   CRP High sensitivity   Lipoprotein A (LPA)   Other Visit Diagnoses     Preventative health care    -  Primary   Relevant Orders   Comprehensive metabolic panel   TSH   CBC   Ambulatory referral to Gastroenterology   Colon cancer screening       Relevant Orders   Ambulatory referral to  Gastroenterology   Flu vaccine need       Relevant Orders   Flu Vaccine QUAD 6+ mos PF IM (Fluarix Quad PF) (Completed)       Follow up: Return in about 1 year (around 01/22/2022) for CPE (fasting).  Wilfred Lacy, NP

## 2021-01-23 MED ORDER — ATORVASTATIN CALCIUM 20 MG PO TABS: 20.0000 mg | ORAL_TABLET | Freq: Every day | ORAL | 1 refills | Status: DC

## 2021-01-23 NOTE — Addendum Note (Signed)
Addended by: Leana Gamer on: 01/23/2021 04:39 PM   Modules accepted: Orders

## 2021-01-23 NOTE — Addendum Note (Signed)
Addended by: Leana Gamer on: 01/23/2021 04:37 PM   Modules accepted: Orders

## 2021-01-25 LAB — LIPOPROTEIN A (LPA): Lipoprotein (a): <10 nmol/L (ref <75)

## 2021-01-26 ENCOUNTER — Encounter: Payer: Self-pay | Admitting: Nurse Practitioner

## 2021-01-26 DIAGNOSIS — E782 Mixed hyperlipidemia: Secondary | ICD-10-CM

## 2021-01-26 DIAGNOSIS — Z8249 Family history of ischemic heart disease and other diseases of the circulatory system: Secondary | ICD-10-CM

## 2021-01-31 ENCOUNTER — Encounter: Payer: Self-pay | Admitting: Gastroenterology

## 2021-02-19 ENCOUNTER — Emergency Department (HOSPITAL_BASED_OUTPATIENT_CLINIC_OR_DEPARTMENT_OTHER)
Admission: EM | Admit: 2021-02-19 | Discharge: 2021-02-19 | Disposition: A | Discharge status: Home / Self Care | Payer: 59 | Attending: Emergency Medicine | Admitting: Emergency Medicine

## 2021-02-19 ENCOUNTER — Emergency Department (HOSPITAL_BASED_OUTPATIENT_CLINIC_OR_DEPARTMENT_OTHER): Payer: 59

## 2021-02-19 ENCOUNTER — Encounter (HOSPITAL_BASED_OUTPATIENT_CLINIC_OR_DEPARTMENT_OTHER): Payer: Self-pay | Admitting: Urology

## 2021-02-19 DIAGNOSIS — Z79899 Other long term (current) drug therapy: Secondary | ICD-10-CM | POA: Diagnosis not present

## 2021-02-19 DIAGNOSIS — R079 Chest pain, unspecified: Secondary | ICD-10-CM | POA: Diagnosis present

## 2021-02-19 LAB — BASIC METABOLIC PANEL
Anion gap: 9 (ref 5–15)
BUN: 14 mg/dL (ref 6–20)
CO2: 24 mmol/L (ref 22–32)
Calcium: 8.9 mg/dL (ref 8.9–10.3)
Chloride: 104 mmol/L (ref 98–111)
Creatinine, Ser: 0.84 mg/dL (ref 0.44–1.00)
GFR, Estimated: >60 mL/min (ref >60)
Glucose, Bld: 128 mg/dL — ABNORMAL HIGH (ref 70–99)
Potassium: 3.1 mmol/L — ABNORMAL LOW (ref 3.5–5.1)
Sodium: 137 mmol/L (ref 135–145)

## 2021-02-19 LAB — CBC
HCT: 43.2 % (ref 36.0–46.0)
Hemoglobin: 14.4 g/dL (ref 12.0–15.0)
MCH: 31.1 pg (ref 26.0–34.0)
MCHC: 33.3 g/dL (ref 30.0–36.0)
MCV: 93.3 fL (ref 80.0–100.0)
Platelets: 269 10*3/uL (ref 150–400)
RBC: 4.63 MIL/uL (ref 3.87–5.11)
RDW: 12.7 % (ref 11.5–15.5)
WBC: 7.5 10*3/uL (ref 4.0–10.5)
nRBC: 0 % (ref 0.0–0.2)

## 2021-02-19 LAB — PREGNANCY, URINE: Preg Test, Ur: NEGATIVE

## 2021-02-19 LAB — TROPONIN I (HIGH SENSITIVITY): Troponin I (High Sensitivity): <2 ng/L (ref <18)

## 2021-02-19 MED ORDER — POTASSIUM CHLORIDE CRYS ER 20 MEQ PO TBCR
40.0000 meq | EXTENDED_RELEASE_TABLET | Freq: Once | ORAL | Status: AC
  Administered 2021-02-19: 40 meq via ORAL
  Filled 2021-02-19: qty 2

## 2021-02-19 MED ORDER — MAGNESIUM OXIDE -MG SUPPLEMENT 400 (240 MG) MG PO TABS
800.0000 mg | ORAL_TABLET | Freq: Once | ORAL | Status: AC
  Administered 2021-02-19: 800 mg via ORAL
  Filled 2021-02-19: qty 2

## 2021-02-19 NOTE — ED Triage Notes (Signed)
Chest tightness x 2 weeks, denies SOB, denies andy radiation of pain.

## 2021-02-19 NOTE — ED Provider Notes (Signed)
[facility] EMERGENCY DEPARTMENT Provider Note   CSN: [id number] Arrival date & time: 02/19/21  1935     History Chief Complaint  Patient presents with   Chest Pain    Jennifer Cook is a 50 y.o. female.   Chest Pain Associated symptoms: no abdominal pain, no back pain, no cough, no dizziness, no fatigue, no fever, no headache, no nausea, no numbness, no palpitations, no shortness of breath, no vomiting and no weakness   Patient presents for intermittent sensation of chest tightness over the past 2 weeks.  Most recently, she experienced at this approximately 3 hours ago.  This was after eating dinner.  When it does occur, she denies any associated symptoms.  Currently, she is asymptomatic. HPI: A 50 year old patient with a history of hypercholesterolemia presents for evaluation of chest pain. Initial onset of pain was approximately 1-3 hours ago. The patient's chest pain is described as heaviness/pressure/tightness and is not worse with exertion. The patient's chest pain is middle- or left-sided, is not well-localized, is not sharp and does not radiate to the arms/jaw/neck. The patient does not complain of nausea and denies diaphoresis. The patient has a family history of coronary artery disease in a first-degree relative with onset less than age 75. The patient has no history of stroke, has no history of peripheral artery disease, has not smoked in the past 90 days, denies any history of treated diabetes, is not hypertensive and does not have an elevated BMI (>=30).   Past Medical History:  Diagnosis Date   Abnormal vaginal Pap smear    HEADACHE 07/29/2007   Qualifier: Diagnosis of  By: Larose Kells MD, [place]    History of chicken pox     Patient Active Problem List   Diagnosis Date Noted   Family history of colonic polyps 01/22/2021   Hallux valgus of right foot 12/19/2020   Mixed hyperlipidemia 06/17/2019   FH: breast cancer 06/16/2019   Shoulder impingement syndrome,  right 02/10/2018   Family history of premature CAD 05/02/2017   HEADACHE 07/29/2007   RAYNAUD'S SYNDROME 12/23/2006    Past Surgical History:  Procedure Laterality Date   BREAST REDUCTION [place]   COLPOSCOPY       OB History   No obstetric history on file.     Family History  Problem Relation Age of Onset   Hyperlipidemia Mother    Thyroid disease Mother    Hypertension Mother    CAD Father        F (~ 29 y/o, MI), GM   Heart attack Father    Cancer Maternal Aunt 42       Breast   Cancer Paternal Aunt 45       breast   Stroke Maternal Grandmother    Alcohol abuse Maternal Grandfather    Cancer Maternal Grandfather    Heart disease Paternal Grandmother 17       MI cause of death   Diabetes Paternal Grandmother    Stroke Paternal Grandfather    Breast cancer Other        aunt x 2    Colon cancer Neg Hx     Social History   Tobacco Use   Smoking status: Never   Smokeless tobacco: Never  Vaping Use   Vaping Use: Never used  Substance Use Topics   Alcohol use: Yes    Comment: Rarely   Drug use: No    Home Medications Prior  to Admission medications   Medication Sig Start Date End Date Taking? Authorizing Provider  atorvastatin (LIPITOR) 20 MG tablet Take 1 tablet (20 mg total) by mouth at bedtime. 01/23/21   Nche, Charlene Brooke, NP  levonorgestrel-ethinyl estradiol (SEASONALE) 0.15-0.03 MG tablet TAKE 1 TABLET BY MOUTH EVERY DAY 02/18/19   [provider]  loratadine (CLARITIN) 10 MG tablet Take 10 mg by mouth daily.    [provider]  Multiple Vitamins-Minerals (CENTRUM WOMEN PO) Take by mouth.    [provider]  Probiotic Product (PROBIOTIC DAILY PO) Take by mouth.    [provider]    Allergies    Hydrocodone, Naproxen, Tetracycline hcl, and Tetracycline hcl  Review of Systems   Review of Systems  Constitutional:  Negative for appetite change, chills, fatigue and fever.  HENT:   Negative for ear pain and sore throat.   Eyes:  Negative for pain and visual disturbance.  Respiratory:  Positive for chest tightness. Negative for cough, shortness of breath and wheezing.   Cardiovascular:  Negative for palpitations and leg swelling.  Gastrointestinal:  Negative for abdominal pain, diarrhea, nausea and vomiting.  Genitourinary:  Negative for dysuria, flank pain, hematuria and pelvic pain.  Musculoskeletal:  Negative for arthralgias, back pain, myalgias and neck pain.  Skin:  Negative for color change and rash.  Neurological:  Negative for dizziness, seizures, syncope, weakness, light-headedness, numbness and headaches.  Hematological:  Does not bruise/bleed easily.  Psychiatric/Behavioral:  Negative for confusion and decreased concentration.   All other systems reviewed and are negative.  Physical Exam Updated Vital Signs BP 127/79   Pulse 63   Temp 98.5 F (36.9 C) (Oral)   Resp 20   Ht 5\' 5"  (1.651 m)   Wt 71.1 kg   LMP 02/06/2021 (Exact Date)   SpO2 100%   BMI 26.08 kg/m   Physical Exam Vitals and nursing note reviewed.  Constitutional:      General: She is not in acute distress.    Appearance: She is well-developed and normal weight. She is not ill-appearing, toxic-appearing or diaphoretic.  HENT:     Head: Normocephalic and atraumatic.  Eyes:     Conjunctiva/sclera: Conjunctivae normal.  Neck:     Vascular: No JVD.  Cardiovascular:     Rate and Rhythm: Normal rate and regular rhythm.     Heart sounds: No murmur heard. Pulmonary:     Effort: Pulmonary effort is normal. No respiratory distress.     Breath sounds: Normal breath sounds. No decreased breath sounds, wheezing, rhonchi or rales.  Chest:     Chest wall: No tenderness.  Abdominal:     Palpations: Abdomen is soft.     Tenderness: There is no abdominal tenderness.  Musculoskeletal:     Cervical back: Normal range of motion and neck supple.     Right lower leg: No edema.     Left lower  leg: No edema.  Skin:    General: Skin is warm and dry.     Coloration: Skin is not pale.  Neurological:     General: No focal deficit present.     Mental Status: She is alert and oriented to person, place, and time.     Cranial Nerves: No cranial nerve deficit.     Motor: No weakness.  Psychiatric:        Mood and Affect: Mood normal.        Behavior: Behavior normal.    ED Results / Procedures / Treatments  Labs (all labs ordered are listed, but only abnormal results are displayed) Labs Reviewed  BASIC METABOLIC PANEL - Abnormal; Notable for the following components:      Result Value   Potassium 3.1 (*)    Glucose, Bld 128 (*)    All other components within normal limits  CBC  PREGNANCY, URINE  TROPONIN I (HIGH SENSITIVITY)    EKG EKG Interpretation  Date/Time:  Monday February 19 2021 19:42:26 EST Ventricular Rate:  85 PR Interval:  152 QRS Duration: 78 QT Interval:  374 QTC Calculation: 445 R Axis:   61 Text Interpretation: Normal sinus rhythm Low voltage QRS Nonspecific ST abnormality Abnormal ECG Confirmed by Godfrey Pick [phone]) on 02/19/2021 10:16:52 PM  Radiology DG Chest 2 View  Result Date: 02/19/2021 CLINICAL DATA:  Chest pain EXAM: CHEST - 2 VIEW COMPARISON:  None. FINDINGS: The heart size and mediastinal contours are within normal limits. Both lungs are clear. The visualized skeletal structures are unremarkable. IMPRESSION: No active cardiopulmonary disease. Electronically Signed   By: Fidela Salisbury M.D.   On: 02/19/2021 19:55    Procedures Procedures   Medications Ordered in ED Medications  potassium chloride SA (KLOR-CON) CR tablet 40 mEq (40 mEq Oral Given 02/19/21 2317)  magnesium oxide (MAG-OX) tablet 800 mg (800 mg Oral Given 02/19/21 2317)    ED Course  I have reviewed the triage vital signs and the nursing notes.  Pertinent labs & imaging results that were available during my care of the patient were reviewed by me and considered in my  medical decision making (see chart for details).    MDM Rules/Calculators/A&P HEAR Score: 3                        Patient is a healthy 50 year old female who presents for concerns of chest pain that has been intermittent over the past 2 weeks and has most recently occurred prior to arrival after eating dinner.  It has since resolved.  She is worried about a cardiac etiology, given that she does have family history of ACS on her father's side.  Prior to being bedded in the ED, work-up was initiated.  Patient's lab work was reassuring.  She was found to have hypokalemia.  Replacement potassium and magnesium were given in the ED.  Chest x-ray and EKG were unremarkable.  Based on symptoms, risk factors, and EKG, patient is a heart score of 3.  Given no symptoms for the past 3 hours, single troponin was ordered and found to be normal.  Patient does see a regular doctor regularly and is engaged in preventative medicine.  Patient has been started on a statin.  She was advised to continue to follow-up with her primary care doctor and to return to the ED if she does experience any further concerning symptoms.  She was discharged in good condition.  Final Clinical Impression(s) / ED Diagnoses Final diagnoses:  Chest pain, unspecified type    Rx / DC Orders ED Discharge Orders     None        Godfrey Pick, MD 02/20/21 1420

## 2021-02-23 MED ORDER — ROSUVASTATIN CALCIUM 10 MG PO TABS: 10.0000 mg | ORAL_TABLET | ORAL | 5 refills | Status: DC

## 2021-03-20 ENCOUNTER — Ambulatory Visit (AMBULATORY_SURGERY_CENTER): Payer: 59

## 2021-03-20 ENCOUNTER — Encounter: Payer: Self-pay | Admitting: Gastroenterology

## 2021-03-20 VITALS — Ht 64.0 in | Wt 157.0 lb

## 2021-03-20 DIAGNOSIS — Z1211 Encounter for screening for malignant neoplasm of colon: Secondary | ICD-10-CM

## 2021-03-20 MED ORDER — NA SULFATE-K SULFATE-MG SULF 17.5-3.13-1.6 GM/177ML PO SOLN: 1.0000 | Freq: Once | ORAL | 0 refills | Status: DC

## 2021-03-20 MED ORDER — ONDANSETRON HCL 4 MG PO TABS
4.0000 mg | ORAL_TABLET | ORAL | 0 refills | Status: DC
Start: 2021-03-20 — End: 2023-01-30

## 2021-03-20 MED ORDER — NA SULFATE-K SULFATE-MG SULF 17.5-3.13-1.6 GM/177ML PO SOLN
1.0000 | Freq: Once | ORAL | 0 refills | Status: AC
Start: 2021-03-20 — End: 2021-03-20

## 2021-03-20 NOTE — Addendum Note (Signed)
Addended by: Etheleen Nicks on: 03/20/2021 01:13 PM   Modules accepted: Orders

## 2021-03-20 NOTE — Progress Notes (Signed)
No egg or soy allergy known to patient  No issues known to pt with past sedation with any surgeries or procedures Patient denies ever being told they had issues or difficulty with intubation  No FH of Malignant Hyperthermia Pt is not on diet pills Pt is not on  home 02  Pt is not on blood thinners  Pt denies issues with constipation  No A fib or A flutter  Pt is fully vaccinated  for Covid   VIRTUAL PREVISIT  PT HAVING SURGERY 03-23-21 FOR BONE SPURS TO RIGHT FOOT BUT STATES SHE WILL BE MOBILE BY 12/27 WITH BOOT.   NO PA's for preps discussed with pt In [facility] today  Discussed with pt there will be an out-of-pocket cost for prep and that varies from $0 to 70 +  dollars - pt verbalized understanding   Due to the COVID-19 pandemic we are asking patients to follow certain guidelines in [facility] and the [place]   Pt aware of COVID protocols and [facility] guidelines

## 2021-03-23 HISTORY — PX: OTHER SURGICAL HISTORY: SHX169

## 2021-03-28 DIAGNOSIS — Z9889 Other specified postprocedural states: Secondary | ICD-10-CM | POA: Insufficient documentation

## 2021-04-03 ENCOUNTER — Encounter: Payer: Self-pay | Admitting: Gastroenterology

## 2021-04-03 ENCOUNTER — Ambulatory Visit (AMBULATORY_SURGERY_CENTER): Payer: 59 | Admitting: Gastroenterology

## 2021-04-03 ENCOUNTER — Other Ambulatory Visit: Payer: Self-pay

## 2021-04-03 VITALS — BP 126/99 | HR 77 | Temp 97.8°F | Resp 19 | Ht 64.0 in | Wt 157.0 lb

## 2021-04-03 DIAGNOSIS — Z1211 Encounter for screening for malignant neoplasm of colon: Secondary | ICD-10-CM | POA: Diagnosis not present

## 2021-04-03 DIAGNOSIS — D125 Benign neoplasm of sigmoid colon: Secondary | ICD-10-CM

## 2021-04-03 DIAGNOSIS — D122 Benign neoplasm of ascending colon: Secondary | ICD-10-CM

## 2021-04-03 DIAGNOSIS — K635 Polyp of colon: Secondary | ICD-10-CM | POA: Diagnosis not present

## 2021-04-03 MED ORDER — SODIUM CHLORIDE 0.9 % IV SOLN: 500.0000 mL | Freq: Once | INTRAVENOUS | Status: DC

## 2021-04-03 NOTE — Op Note (Signed)
[facility] Patient Name: Jennifer Cook Procedure Date: 04/03/2021 7:59 AM MRN: [id number] Endoscopist: Thornton Park MD, MD Age: 50 Referring MD:  Date of Birth: 1970/07/29 Gender: Female Account #: [id number] Procedure:                Colonoscopy Indications:              Screening for colorectal malignant neoplasm, This                            is the patient's first colonoscopy                           No known family history of colon cancer or polyps Medicines:                Monitored Anesthesia Care Procedure:                Pre-Anesthesia Assessment:                           - Prior to the procedure, a History and Physical                            was performed, and patient medications and                            allergies were reviewed. The patient's tolerance of                            previous anesthesia was also reviewed. The risks                            and benefits of the procedure and the sedation                            options and risks were discussed with the patient.                            All questions were answered, and informed consent                            was obtained. Prior Anticoagulants: The patient has                            taken no previous anticoagulant or antiplatelet                            agents. ASA Grade Assessment: II - A patient with                            mild systemic disease. After reviewing the risks                            and benefits, the patient was deemed in  satisfactory condition to undergo the procedure.                           After obtaining informed consent, the colonoscope                            was passed under direct vision. Throughout the                            procedure, the patient's blood pressure, pulse, and                            oxygen saturations were monitored continuously. The                            Olympus CF-HQ190L  ([id number]) Colonoscope was                            introduced through the anus and advanced to the 3                            cm into the ileum. A second forward view of the                            right colon was performed. The colonoscopy was                            performed without difficulty. The patient tolerated                            the procedure well. The quality of the bowel                            preparation was good. The terminal ileum, ileocecal                            valve, appendiceal orifice, and rectum were                            photographed. Scope In: 9:28:39 AM Scope Out: 9:41:09 AM Scope Withdrawal Time: 0 hours 10 minutes 36 seconds  Total Procedure Duration: 0 hours 12 minutes 30 seconds  Findings:                 The perianal and digital rectal examinations were                            normal.                           Non-bleeding internal hemorrhoids were found.                           A 4 mm polyp was found in the sigmoid colon. The  polyp was sessile. The polyp was removed with a                            cold snare. Resection and retrieval were complete.                            Estimated blood loss was minimal.                           A 4-5 mm polyp was found in the ascending colon.                            The polyp was sessile. The polyp was removed with a                            cold snare. Resection and retrieval were complete.                            Estimated blood loss was minimal.                           The exam was otherwise without abnormality on                            direct and retroflexion views. Complications:            No immediate complications. Estimated blood loss:                            Minimal. Estimated Blood Loss:     Estimated blood loss was minimal. Impression:               - Non-bleeding internal hemorrhoids.                           - One 4 mm  polyp in the sigmoid colon, removed with                            a cold snare. Resected and retrieved.                           - One 4-5 mm polyp in the ascending colon, removed                            with a cold snare. Resected and retrieved.                           - The examination was otherwise normal on direct                            and retroflexion views. Recommendation:           - Patient has a contact number available for  emergencies. The signs and symptoms of potential                            delayed complications were discussed with the                            patient. Return to normal activities tomorrow.                            Written discharge instructions were provided to the                            patient.                           - Resume previous diet.                           - Continue present medications.                           - Await pathology results.                           - Repeat colonoscopy date to be determined after                            pending pathology results are reviewed for                            surveillance.                           - Emerging evidence supports eating a diet of                            fruits, vegetables, grains, calcium, and yogurt                            while reducing red meat and alcohol may reduce the                            risk of colon cancer.                           - Thank you for allowing me to be involved in your                            colon cancer prevention. Thornton Park MD, MD 04/03/2021 9:50:43 AM This report has been signed electronically.

## 2021-04-03 NOTE — Patient Instructions (Signed)

## 2021-04-03 NOTE — Progress Notes (Signed)
Called to room to assist during endoscopic procedure.  Patient ID and intended procedure confirmed with present staff. Received instructions for my participation in the procedure from the performing physician.  

## 2021-04-03 NOTE — Progress Notes (Signed)
PT taken to PACU. Monitors in place. VSS. Report given to RN. 

## 2021-04-03 NOTE — Progress Notes (Signed)
Referring Provider: Flossie Buffy, NP Primary Care Physician:  Flossie Buffy, NP  Reason for Procedure:  Colon cancer screening   IMPRESSION:  Need for colon cancer screening Appropriate candidate for monitored anesthesia care  PLAN: Colonoscopy in the [facility] today   HPI: Jennifer Cook is a 50 y.o. female presents for screening colonoscopy.  No prior colonoscopy or colon cancer screening.  No baseline GI symptoms.   No known family history of colon cancer or polyps. No family history of uterine/endometrial cancer, pancreatic cancer or gastric/stomach cancer.   Past Medical History:  Diagnosis Date   Abnormal vaginal Pap smear    Allergy    HEADACHE 07/29/2007   Qualifier: Diagnosis of  By: Larose Kells MD, [place]    History of chicken pox    Hyperlipidemia    Raynaud's disease     Past Surgical History:  Procedure Laterality Date   bone spur removal Right 03/23/2021   BREAST REDUCTION SURGERY  1996   CESAREAN SECTION  07/1997   COLPOSCOPY      Current Outpatient Medications  Medication Sig Dispense Refill   levonorgestrel-ethinyl estradiol (SEASONALE) 0.15-0.03 MG tablet TAKE 1 TABLET BY MOUTH EVERY DAY     ondansetron (ZOFRAN) 4 MG tablet Take 1 tablet (4 mg total) by mouth as directed. Take 1 -4 mg Zofran 30-60 minutes before the prep dose the day before the colonoscopy Take 1-4 mg Zofran 30-60 minutes before the prep dose the day of the colonoscopy 2 tablet 0   rosuvastatin (CRESTOR) 10 MG tablet Take 1 tablet (10 mg total) by mouth 3 (three) times a week. 13 tablet 5   HYDROmorphone (DILAUDID) 2 MG tablet Take by mouth. (Patient not taking: Reported on 03/20/2021)     loratadine (CLARITIN) 10 MG tablet Take 10 mg by mouth daily.     Multiple Vitamins-Minerals (CENTRUM WOMEN PO) Take by mouth.     Probiotic Product (PROBIOTIC DAILY PO) Take by mouth.     Current Facility-Administered Medications  Medication Dose Route Frequency Provider Last Rate Last  Admin   0.9 %  sodium chloride infusion  500 mL Intravenous Once Thornton Park, MD        Allergies as of 04/03/2021 - Review Complete 04/03/2021  Allergen Reaction Noted   Hydrocodone Nausea And Vomiting 12/23/2006   Naproxen Nausea And Vomiting 02/16/2007   Tetracycline hcl  12/23/2006   Tetracycline hcl  12/26/2014    Family History  Problem Relation Age of Onset   Hyperlipidemia Mother    Thyroid disease Mother    Hypertension Mother    CAD Father        F (~ 37 y/o, MI), GM   Heart attack Father    Cancer Maternal Aunt 40       Breast   Cancer Paternal Aunt 16       breast   Stroke Maternal Grandmother    Alcohol abuse Maternal Grandfather    Cancer Maternal Grandfather    Heart disease Paternal Grandmother 58       MI cause of death   Diabetes Paternal Grandmother    Stroke Paternal Grandfather    Breast cancer Other        aunt x 2    Colon cancer Neg Hx    Colon polyps Neg Hx    Esophageal cancer Neg Hx    Stomach cancer Neg Hx    Rectal cancer Neg Hx      Physical Exam: General:  Alert,  well-nourished, pleasant and cooperative in NAD Head:  Normocephalic and atraumatic. Eyes:  Sclera clear, no icterus.   Conjunctiva pink. Mouth:  No deformity or lesions.   Neck:  Supple; no masses or thyromegaly. Lungs:  Clear throughout to auscultation.   No wheezes. Heart:  Regular rate and rhythm; no murmurs. Abdomen:  Soft, non-tender, nondistended, normal bowel sounds, no rebound or guarding.  Msk:  Symmetrical. No boney deformities LAD: No inguinal or umbilical LAD Extremities:  No clubbing or edema. Neurologic:  Alert and  oriented x4;  grossly nonfocal Skin:  No obvious rash or bruise. Psych:  Alert and cooperative. Normal mood and affect.     Studies/Results: No results found.    Latrish Mogel L. Tarri Glenn, MD, MPH 04/03/2021, 9:16 AM

## 2021-04-03 NOTE — Progress Notes (Signed)
Jennifer Cook  Pt's states no medical or surgical changes since previsit or office visit.

## 2021-04-05 ENCOUNTER — Telehealth: Payer: Self-pay | Admitting: *Deleted

## 2021-04-05 ENCOUNTER — Encounter: Payer: Self-pay | Admitting: Gastroenterology

## 2021-04-05 NOTE — Telephone Encounter (Signed)
°  Follow up Call-  Call back number 04/03/2021  Post procedure Call Back phone  # [phone]  Permission to leave phone message Yes  Some recent data might be hidden     Patient questions:  Message left to call us if necessary.

## 2021-04-23 ENCOUNTER — Encounter: Payer: Self-pay | Admitting: Nurse Practitioner

## 2021-04-23 ENCOUNTER — Other Ambulatory Visit (INDEPENDENT_AMBULATORY_CARE_PROVIDER_SITE_OTHER): Payer: BC Managed Care – PPO

## 2021-04-23 ENCOUNTER — Other Ambulatory Visit: Payer: Self-pay

## 2021-04-23 DIAGNOSIS — E782 Mixed hyperlipidemia: Secondary | ICD-10-CM

## 2021-04-23 DIAGNOSIS — Z8249 Family history of ischemic heart disease and other diseases of the circulatory system: Secondary | ICD-10-CM

## 2021-04-23 LAB — LIPID PANEL
Cholesterol: 213 mg/dL — ABNORMAL HIGH (ref 0–200)
HDL: 51.9 mg/dL (ref >39.00)
LDL Cholesterol: 126 mg/dL — ABNORMAL HIGH (ref 0–99)
NonHDL: 161.05
Total CHOL/HDL Ratio: 4
Triglycerides: 174 mg/dL — ABNORMAL HIGH (ref 0.0–149.0)
VLDL: 34.8 mg/dL (ref 0.0–40.0)

## 2021-04-23 LAB — HIGH SENSITIVITY CRP: CRP, High Sensitivity: 4.85 mg/L (ref 0.000–5.000)

## 2021-04-23 MED ORDER — ROSUVASTATIN CALCIUM 10 MG PO TABS: 10.0000 mg | ORAL_TABLET | Freq: Every day | ORAL | 1 refills | Status: DC

## 2021-04-23 NOTE — Assessment & Plan Note (Signed)
Normal Hs-CRP Abnormal lipid panel with crestor 10mg  3x/week Lipid Panel     Component Value Date/Time   CHOL 213 (H) 04/23/2021 0759   TRIG 174.0 (H) 04/23/2021 0759   HDL 51.90 04/23/2021 0759   CHOLHDL 4 04/23/2021 0759   VLDL 34.8 04/23/2021 0759   LDLCALC 126 (H) 04/23/2021 0759   LDLDIRECT 150.2 04/18/2008 0829   Switch to crestor 10mg  daily Advised about need for DASH diet and daily exercise. She decline referral to cardiology for CT coronary calcium score. Repeat lipid panel in 88months (fasting)

## 2021-04-23 NOTE — Addendum Note (Signed)
Addended by: Wilfred Lacy L on: 04/23/2021 01:21 PM   Modules accepted: Orders

## 2021-04-23 NOTE — Progress Notes (Signed)
From [employer], per patient she is fine with switching to daily Rx and will work more on diet as well. Sw, cma

## 2021-07-16 ENCOUNTER — Encounter: Payer: Self-pay | Admitting: Nurse Practitioner

## 2021-07-23 ENCOUNTER — Other Ambulatory Visit (INDEPENDENT_AMBULATORY_CARE_PROVIDER_SITE_OTHER): Payer: BC Managed Care – PPO

## 2021-07-23 DIAGNOSIS — Z111 Encounter for screening for respiratory tuberculosis: Secondary | ICD-10-CM

## 2021-07-23 DIAGNOSIS — Z8249 Family history of ischemic heart disease and other diseases of the circulatory system: Secondary | ICD-10-CM

## 2021-07-23 DIAGNOSIS — E782 Mixed hyperlipidemia: Secondary | ICD-10-CM | POA: Diagnosis not present

## 2021-07-23 LAB — LIPID PANEL
Cholesterol: 184 mg/dL (ref 0–200)
HDL: 52.2 mg/dL (ref >39.00)
LDL Cholesterol: 110 mg/dL — ABNORMAL HIGH (ref 0–99)
NonHDL: 131.52
Total CHOL/HDL Ratio: 4
Triglycerides: 110 mg/dL (ref 0.0–149.0)
VLDL: 22 mg/dL (ref 0.0–40.0)

## 2021-07-25 ENCOUNTER — Encounter: Payer: Self-pay | Admitting: Nurse Practitioner

## 2021-07-25 LAB — QUANTIFERON-TB GOLD PLUS
Mitogen-NIL: >10 IU/mL
NIL: 0.02 IU/mL
QuantiFERON-TB Gold Plus: NEGATIVE
TB1-NIL: 0 IU/mL
TB2-NIL: 0.01 IU/mL

## 2021-07-25 LAB — URINE CULTURE
MICRO NUMBER:: 13272605
Result:: NO GROWTH
SPECIMEN QUALITY:: ADEQUATE

## 2021-10-13 ENCOUNTER — Other Ambulatory Visit: Payer: Self-pay | Admitting: Nurse Practitioner

## 2021-10-13 DIAGNOSIS — E782 Mixed hyperlipidemia: Secondary | ICD-10-CM

## 2021-10-13 DIAGNOSIS — Z8249 Family history of ischemic heart disease and other diseases of the circulatory system: Secondary | ICD-10-CM

## 2021-10-15 NOTE — Telephone Encounter (Signed)
Chart Supports Rx Last OV: 01/2021 Next OV: Not scheduled  Pt needs appt for further refills

## 2021-11-14 ENCOUNTER — Other Ambulatory Visit: Payer: Self-pay | Admitting: Nurse Practitioner

## 2021-11-14 DIAGNOSIS — E782 Mixed hyperlipidemia: Secondary | ICD-10-CM

## 2021-11-14 DIAGNOSIS — Z8249 Family history of ischemic heart disease and other diseases of the circulatory system: Secondary | ICD-10-CM

## 2021-11-14 NOTE — Telephone Encounter (Signed)
Chart supports Rx Last OV: 01/2021 Next OV: Not scheduled

## 2021-12-19 ENCOUNTER — Other Ambulatory Visit: Payer: Self-pay | Admitting: Nurse Practitioner

## 2021-12-19 DIAGNOSIS — Z8249 Family history of ischemic heart disease and other diseases of the circulatory system: Secondary | ICD-10-CM

## 2021-12-19 DIAGNOSIS — E782 Mixed hyperlipidemia: Secondary | ICD-10-CM

## 2021-12-19 NOTE — Telephone Encounter (Signed)
Chart supports Rx Last OV: 01/2021 Next OV: not scheduled   30 day supply sent, pt needs appt for additional refills.

## 2021-12-24 ENCOUNTER — Encounter: Payer: Self-pay | Admitting: Nurse Practitioner

## 2021-12-24 DIAGNOSIS — Z8249 Family history of ischemic heart disease and other diseases of the circulatory system: Secondary | ICD-10-CM

## 2021-12-24 DIAGNOSIS — E782 Mixed hyperlipidemia: Secondary | ICD-10-CM

## 2021-12-26 NOTE — Addendum Note (Signed)
Addended by: Wilfred Lacy L on: 12/26/2021 03:07 PM   Modules accepted: Orders

## 2022-01-14 ENCOUNTER — Ambulatory Visit (HOSPITAL_BASED_OUTPATIENT_CLINIC_OR_DEPARTMENT_OTHER)
Admission: RE | Admit: 2022-01-14 | Discharge: 2022-01-14 | Disposition: A | Discharge status: Home / Self Care | Payer: Self-pay | Source: Ambulatory Visit | Attending: Nurse Practitioner | Admitting: Nurse Practitioner

## 2022-01-14 DIAGNOSIS — E782 Mixed hyperlipidemia: Secondary | ICD-10-CM | POA: Insufficient documentation

## 2022-01-14 DIAGNOSIS — Z8249 Family history of ischemic heart disease and other diseases of the circulatory system: Secondary | ICD-10-CM | POA: Insufficient documentation

## 2022-01-30 ENCOUNTER — Telehealth: Payer: Self-pay | Admitting: Nurse Practitioner

## 2022-01-31 MED ORDER — ROSUVASTATIN CALCIUM 10 MG PO TABS: 10.0000 mg | ORAL_TABLET | Freq: Every day | ORAL | 3 refills | Status: DC

## 2022-01-31 NOTE — Addendum Note (Signed)
Addended by: Wilfred Lacy L on: 01/31/2022 01:47 PM   Modules accepted: Orders

## 2022-02-07 NOTE — Telephone Encounter (Signed)
error 

## 2022-02-14 ENCOUNTER — Encounter: Payer: Self-pay | Admitting: Nurse Practitioner

## 2022-02-14 ENCOUNTER — Ambulatory Visit: Payer: 59 | Admitting: Nurse Practitioner

## 2022-02-14 VITALS — BP 122/78 | HR 85 | Temp 98.0°F | Wt 166.4 lb

## 2022-02-14 DIAGNOSIS — E782 Mixed hyperlipidemia: Secondary | ICD-10-CM | POA: Diagnosis not present

## 2022-02-14 DIAGNOSIS — Z8249 Family history of ischemic heart disease and other diseases of the circulatory system: Secondary | ICD-10-CM

## 2022-02-14 LAB — HEPATIC FUNCTION PANEL
ALT: 17 U/L (ref 0–35)
AST: 17 U/L (ref 0–37)
Albumin: 4 g/dL (ref 3.5–5.2)
Alkaline Phosphatase: 42 U/L (ref 39–117)
Bilirubin, Direct: 0.1 mg/dL (ref 0.0–0.3)
Total Bilirubin: 0.4 mg/dL (ref 0.2–1.2)
Total Protein: 6.9 g/dL (ref 6.0–8.3)

## 2022-02-14 LAB — LIPID PANEL
Cholesterol: 191 mg/dL (ref 0–200)
HDL: 51.1 mg/dL (ref >39.00)
LDL Cholesterol: 117 mg/dL — ABNORMAL HIGH (ref 0–99)
NonHDL: 140.06
Total CHOL/HDL Ratio: 4
Triglycerides: 117 mg/dL (ref 0.0–149.0)
VLDL: 23.4 mg/dL (ref 0.0–40.0)

## 2022-02-14 NOTE — Patient Instructions (Signed)
Go to lab Maintain current medication 

## 2022-02-14 NOTE — Assessment & Plan Note (Addendum)
Ct cardiac calcium score 01/2022: Coronary calcium score of 1.07 in Left anterior descending artery only.

## 2022-02-14 NOTE — Assessment & Plan Note (Addendum)
Repeat lipid and hepatic panel. No adverse effects with crestor

## 2022-02-14 NOTE — Progress Notes (Signed)
                Established Patient Visit  Patient: Jennifer Cook   DOB: 14-May-1970   51 y.o. Female  MRN: [id number] Visit Date: 02/14/2022  Subjective:    Chief Complaint  Patient presents with   Follow-up    F/U for med refill. No other concerns. Requesting records for mammo and pap.   HPI Mixed hyperlipidemia Repeat lipid and hepatic panel. No adverse effects with crestor   Family history of premature CAD Ct cardiac calcium score 01/2022: Coronary calcium score of 1.07 in Left anterior descending artery only.   Reviewed medical, surgical, and social history today  Medications: Outpatient Medications Prior to Visit  Medication Sig   levonorgestrel-ethinyl estradiol (SEASONALE) 0.15-0.03 MG tablet TAKE 1 TABLET BY MOUTH EVERY DAY   loratadine (CLARITIN) 10 MG tablet Take 10 mg by mouth daily.   Probiotic Product (PROBIOTIC DAILY PO) Take by mouth.   rosuvastatin (CRESTOR) 10 MG tablet Take 1 tablet (10 mg total) by mouth daily.   ondansetron (ZOFRAN) 4 MG tablet Take 1 tablet (4 mg total) by mouth as directed. Take 1 -4 mg Zofran 30-60 minutes before the prep dose the day before the colonoscopy Take 1-4 mg Zofran 30-60 minutes before the prep dose the day of the colonoscopy (Patient not taking: Reported on 02/14/2022)   [DISCONTINUED] Multiple Vitamins-Minerals (CENTRUM WOMEN PO) Take by mouth. (Patient not taking: Reported on 02/14/2022)   No facility-administered medications prior to visit.   Reviewed past medical and social history.   ROS per HPI above      Objective:  BP 122/78 (BP Location: Right Arm, Patient Position: Sitting)   Pulse 85   Temp 98 F (36.7 C)   Wt 166 lb 6.4 oz (75.5 kg)   SpO2 97%   BMI 28.56 kg/m      Physical Exam Cardiovascular:     Rate and Rhythm: Normal rate and regular rhythm.     Pulses: Normal pulses.     Heart sounds: Normal heart sounds.  Pulmonary:     Effort: Pulmonary effort is normal.     Breath sounds: Normal  breath sounds.  Neurological:     Mental Status: She is alert and oriented to person, place, and time.     No results found for any visits on 02/14/22.    Assessment & Plan:    Problem List Items Addressed This Visit       Other   Family history of premature CAD    Ct cardiac calcium score 01/2022: Coronary calcium score of 1.07 in Left anterior descending artery only.      Mixed hyperlipidemia - Primary    Repeat lipid and hepatic panel. No adverse effects with crestor       Relevant Orders   Lipid panel   Hepatic function panel   Return in about 1 year (around 02/15/2023) for CPE (fasting).     Wilfred Lacy, NP

## 2022-05-22 IMAGING — DX DG CHEST 2V
2 series · 2 of 2 positions shown · non-contrast
Comparison: None.

CLINICAL DATA: Chest pain

EXAM:
CHEST - 2 VIEW

[chest pa]
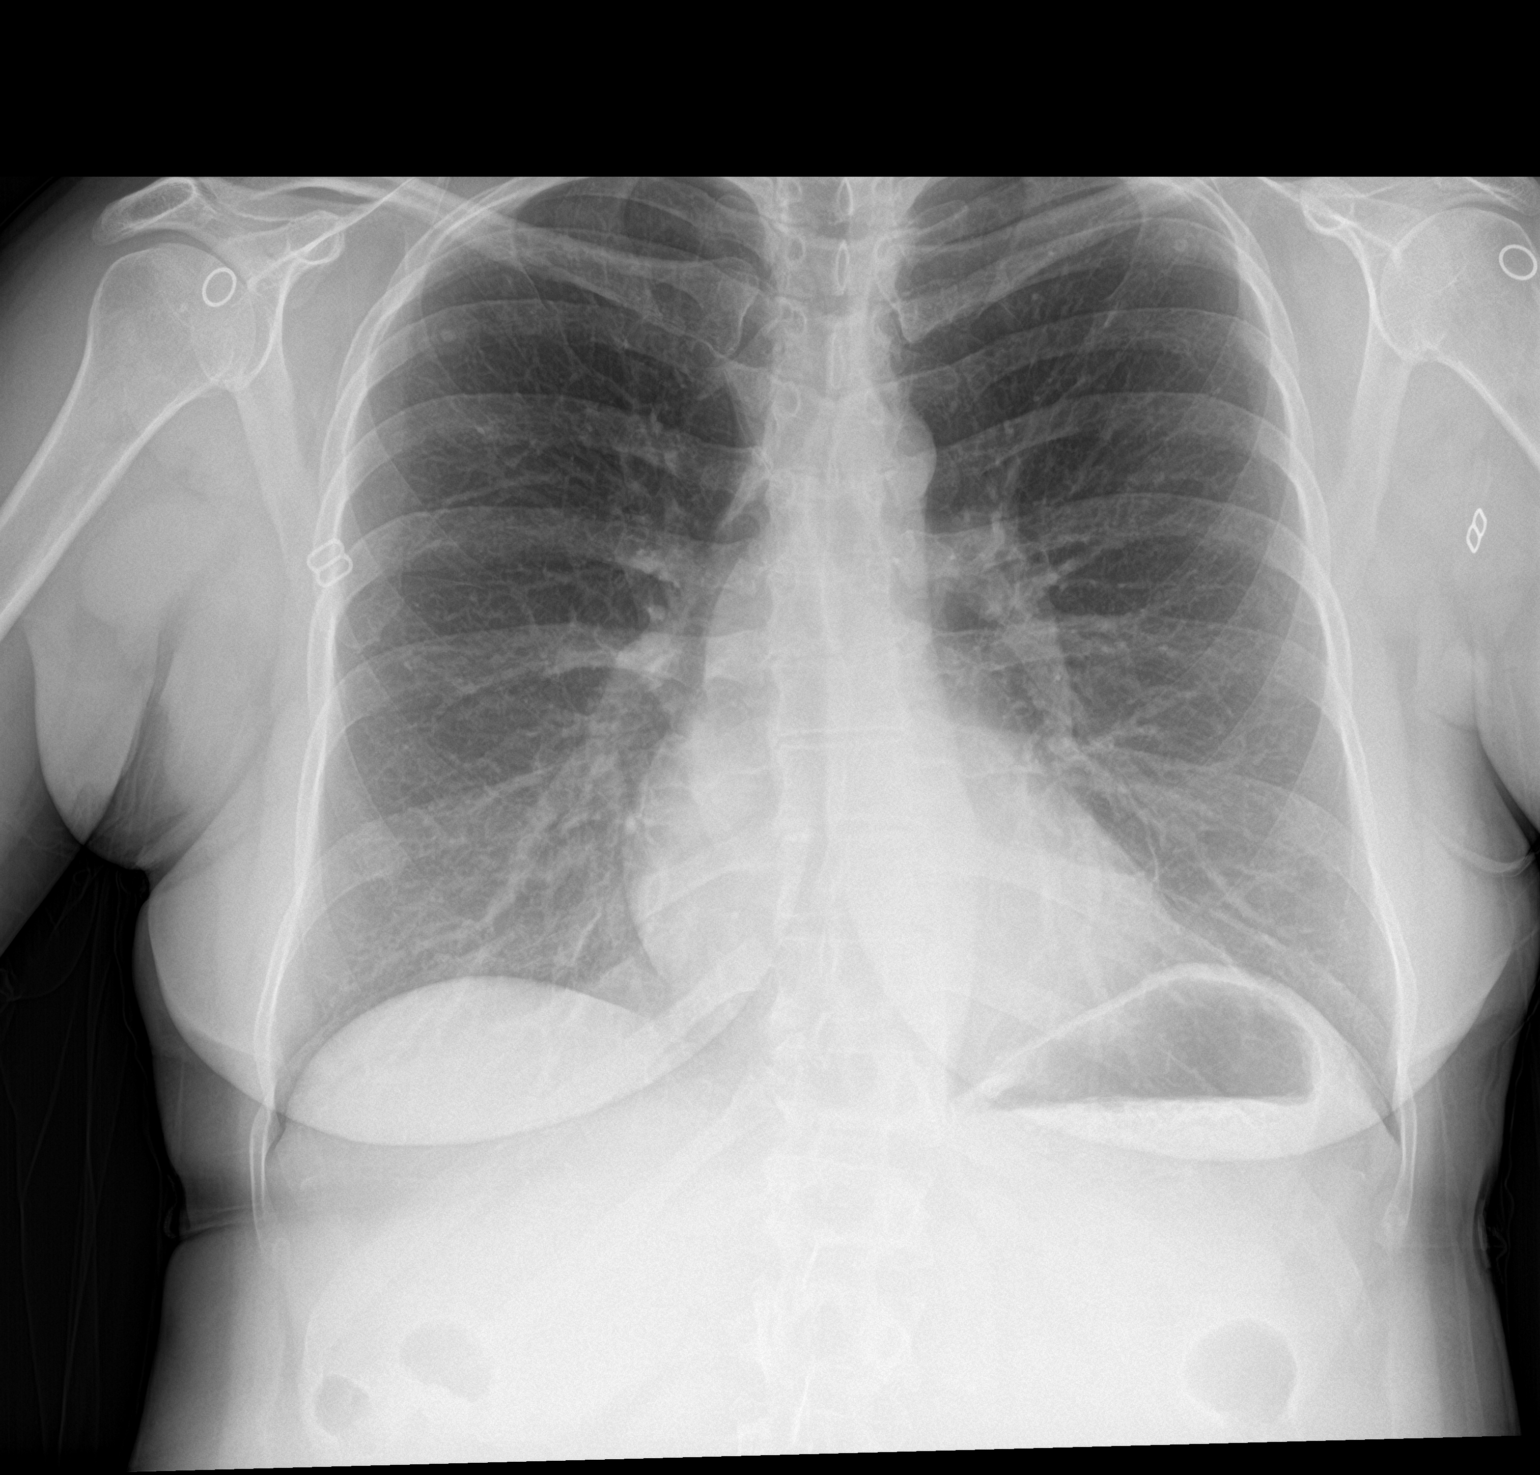

[chest lat]
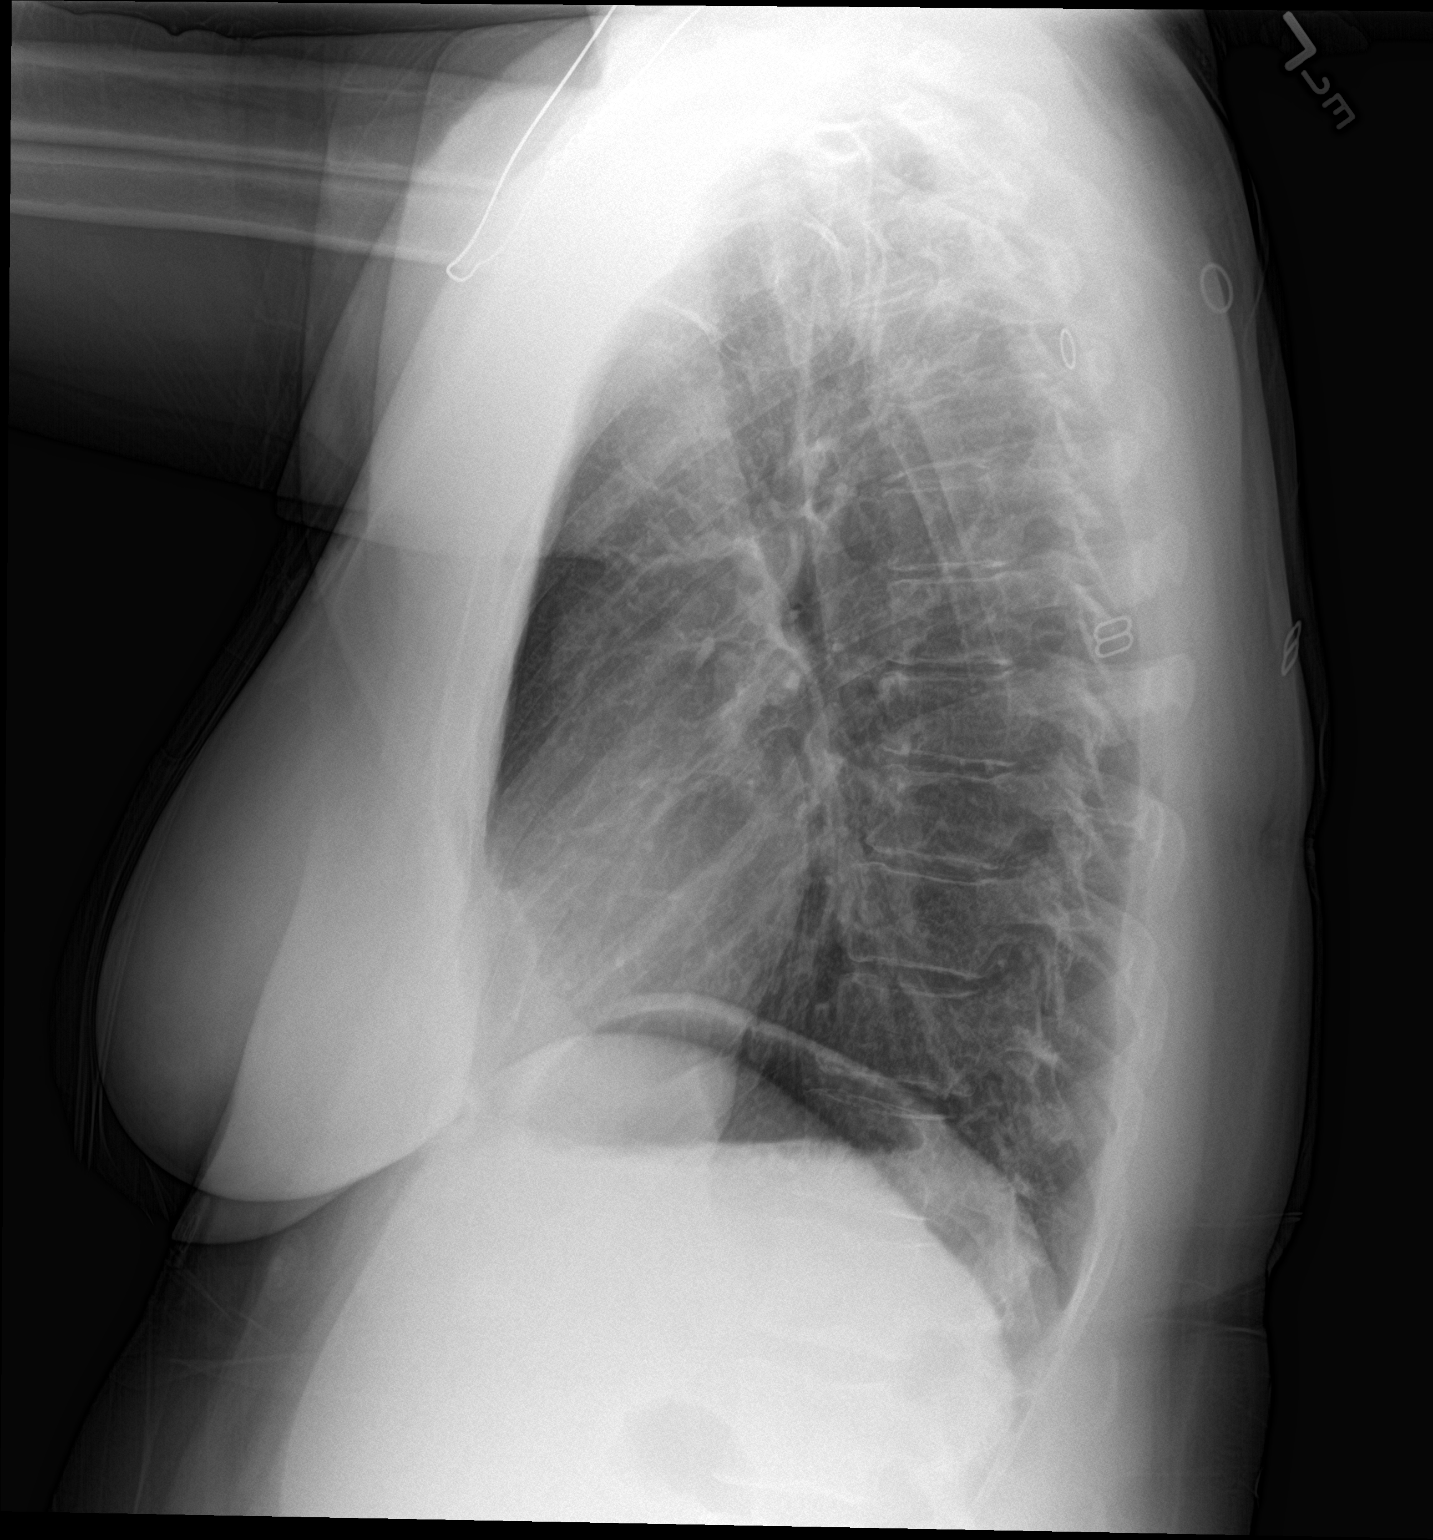

[2 of 2 positions shown; findings below may reference images not displayed]

FINDINGS: The heart size and mediastinal contours are within normal limits.
Both lungs are clear. The visualized skeletal structures are
unremarkable.
IMPRESSION: No active cardiopulmonary disease.

## 2022-05-24 LAB — HM MAMMOGRAPHY

## 2022-08-20 ENCOUNTER — Other Ambulatory Visit: Payer: 59

## 2022-08-20 DIAGNOSIS — Z111 Encounter for screening for respiratory tuberculosis: Secondary | ICD-10-CM

## 2022-08-22 LAB — QUANTIFERON-TB GOLD PLUS
Mitogen-NIL: >10 IU/mL
NIL: 0.04 IU/mL
QuantiFERON-TB Gold Plus: NEGATIVE
TB1-NIL: 0 IU/mL
TB2-NIL: 0 IU/mL

## 2022-08-23 NOTE — Progress Notes (Signed)
Stable Follow instructions as discussed during office visit.

## 2023-01-27 ENCOUNTER — Other Ambulatory Visit: Payer: Self-pay | Admitting: Nurse Practitioner

## 2023-01-27 DIAGNOSIS — E782 Mixed hyperlipidemia: Secondary | ICD-10-CM

## 2023-01-30 ENCOUNTER — Encounter: Payer: Self-pay | Admitting: Nurse Practitioner

## 2023-01-30 ENCOUNTER — Ambulatory Visit: Payer: 59 | Admitting: Nurse Practitioner

## 2023-01-30 VITALS — BP 119/65 | HR 74 | Temp 97.1°F | Resp 18 | Ht 64.0 in | Wt 160.4 lb

## 2023-01-30 DIAGNOSIS — E782 Mixed hyperlipidemia: Secondary | ICD-10-CM

## 2023-01-30 LAB — COMPREHENSIVE METABOLIC PANEL
ALT: 20 U/L (ref 0–35)
AST: 20 U/L (ref 0–37)
Albumin: 4.3 g/dL (ref 3.5–5.2)
Alkaline Phosphatase: 41 U/L (ref 39–117)
BUN: 16 mg/dL (ref 6–23)
CO2: 26 meq/L (ref 19–32)
Calcium: 9.3 mg/dL (ref 8.4–10.5)
Chloride: 105 meq/L (ref 96–112)
Creatinine, Ser: 0.94 mg/dL (ref 0.40–1.20)
GFR: 70.33 mL/min (ref >60.00)
Glucose, Bld: 77 mg/dL (ref 70–99)
Potassium: 4.1 meq/L (ref 3.5–5.1)
Sodium: 138 meq/L (ref 135–145)
Total Bilirubin: 0.5 mg/dL (ref 0.2–1.2)
Total Protein: 7.1 g/dL (ref 6.0–8.3)

## 2023-01-30 LAB — LIPID PANEL
Cholesterol: 169 mg/dL (ref 0–200)
HDL: 44.1 mg/dL (ref >39.00)
LDL Cholesterol: 100 mg/dL — ABNORMAL HIGH (ref 0–99)
NonHDL: 124.71
Total CHOL/HDL Ratio: 4
Triglycerides: 125 mg/dL (ref 0.0–149.0)
VLDL: 25 mg/dL (ref 0.0–40.0)

## 2023-01-30 NOTE — Patient Instructions (Signed)
Go to lab Continue Heart healthy diet and daily exercise. Maintain current medications. 

## 2023-01-30 NOTE — Progress Notes (Signed)
                Established Patient Visit  Patient: Jennifer Cook   DOB: 15-Oct-1970   52 y.o. Female  MRN: [id number] Visit Date: 01/30/2023  Subjective:    Chief Complaint  Patient presents with   office visit     PT is here for medication management and RX refills for Crestor 10 mg    HPI Mixed hyperlipidemia Normal LpA and CT cardiac coronary score Elevated HsCRP:9 No HYPERTENSION or DIABETES or tobacco use or PAD No adverse effects with crestor 10mg  Has maintained heart healthy diet and daily exercise  Repeat lipid panel and CMP Maintain current med dose   Wt Readings from Last 3 Encounters:  01/30/23 160 lb 6.4 oz (72.8 kg)  02/14/22 166 lb 6.4 oz (75.5 kg)  04/03/21 157 lb (71.2 kg)    Reviewed medical, surgical, and social history today  Medications: Outpatient Medications Prior to Visit  Medication Sig Note   levonorgestrel-ethinyl estradiol (SEASONALE) 0.15-0.03 MG tablet TAKE 1 TABLET BY MOUTH EVERY DAY    loratadine (CLARITIN) 10 MG tablet Take 10 mg by mouth daily.    Probiotic Product (PROBIOTIC DAILY PO) Take by mouth.    rosuvastatin (CRESTOR) 10 MG tablet Take 1 tablet (10 mg total) by mouth daily. 01/30/2023: Refills are needed    [DISCONTINUED] ondansetron (ZOFRAN) 4 MG tablet Take 1 tablet (4 mg total) by mouth as directed. Take 1 -4 mg Zofran 30-60 minutes before the prep dose the day before the colonoscopy Take 1-4 mg Zofran 30-60 minutes before the prep dose the day of the colonoscopy (Patient not taking: Reported on 02/14/2022)    No facility-administered medications prior to visit.   Reviewed past medical and social history.   ROS per HPI above      Objective:  BP 119/65 (BP Location: Left Arm, Patient Position: Sitting, Cuff Size: Normal)   Pulse 74   Temp (!) 97.1 F (36.2 C) (Temporal)   Resp 18   Ht 5\' 4"  (1.626 m)   Wt 160 lb 6.4 oz (72.8 kg)   SpO2 100%   BMI 27.53 kg/m      Physical Exam Vitals and nursing note reviewed.   Cardiovascular:     Rate and Rhythm: Normal rate and regular rhythm.     Pulses: Normal pulses.     Heart sounds: Normal heart sounds.  Pulmonary:     Effort: Pulmonary effort is normal.     Breath sounds: Normal breath sounds.  Musculoskeletal:     Right lower leg: No edema.     Left lower leg: No edema.  Neurological:     Mental Status: She is alert and oriented to person, place, and time.     No results found for any visits on 01/30/23.    Assessment & Plan:    Problem List Items Addressed This Visit     Mixed hyperlipidemia - Primary    Normal LpA and CT cardiac coronary score Elevated HsCRP:9 No HYPERTENSION or DIABETES or tobacco use or PAD No adverse effects with crestor 10mg  Has maintained heart healthy diet and daily exercise  Repeat lipid panel and CMP Maintain current med dose        Relevant Orders   Comprehensive metabolic panel   Lipid Profile   Return in about 6 months (around 07/31/2023) for CPE (fasting).     Alysia Penna, NP

## 2023-01-30 NOTE — Assessment & Plan Note (Addendum)
Normal LpA and CT cardiac coronary score Elevated HsCRP:9 No HYPERTENSION or DIABETES or tobacco use or PAD No adverse effects with crestor 10mg  Has maintained heart healthy diet and daily exercise  Repeat lipid panel and CMP Maintain current med dose

## 2023-01-31 ENCOUNTER — Encounter: Payer: Self-pay | Admitting: Nurse Practitioner

## 2023-02-03 ENCOUNTER — Encounter: Payer: Self-pay | Admitting: Nurse Practitioner

## 2023-02-03 DIAGNOSIS — E782 Mixed hyperlipidemia: Secondary | ICD-10-CM

## 2023-02-03 MED ORDER — ROSUVASTATIN CALCIUM 10 MG PO TABS: 10.0000 mg | ORAL_TABLET | Freq: Every day | ORAL | 3 refills | Status: DC

## 2023-06-06 LAB — HM MAMMOGRAPHY

## 2023-07-04 ENCOUNTER — Encounter: Payer: Self-pay | Admitting: Nurse Practitioner

## 2023-07-04 ENCOUNTER — Ambulatory Visit (INDEPENDENT_AMBULATORY_CARE_PROVIDER_SITE_OTHER): Payer: 59 | Admitting: Nurse Practitioner

## 2023-07-04 VITALS — BP 112/66 | HR 76 | Temp 98.2°F | Ht 64.0 in | Wt 156.8 lb

## 2023-07-04 DIAGNOSIS — Z0000 Encounter for general adult medical examination without abnormal findings: Secondary | ICD-10-CM

## 2023-07-04 DIAGNOSIS — Z0001 Encounter for general adult medical examination with abnormal findings: Secondary | ICD-10-CM

## 2023-07-04 DIAGNOSIS — Z111 Encounter for screening for respiratory tuberculosis: Secondary | ICD-10-CM

## 2023-07-04 DIAGNOSIS — E782 Mixed hyperlipidemia: Secondary | ICD-10-CM

## 2023-07-04 LAB — COMPREHENSIVE METABOLIC PANEL WITH GFR
ALT: 14 U/L (ref 0–35)
AST: 15 U/L (ref 0–37)
Albumin: 4.2 g/dL (ref 3.5–5.2)
Alkaline Phosphatase: 38 U/L — ABNORMAL LOW (ref 39–117)
BUN: 14 mg/dL (ref 6–23)
CO2: 25 meq/L (ref 19–32)
Calcium: 8.8 mg/dL (ref 8.4–10.5)
Chloride: 108 meq/L (ref 96–112)
Creatinine, Ser: 0.82 mg/dL (ref 0.40–1.20)
GFR: 82.61 mL/min (ref >60.00)
Glucose, Bld: 83 mg/dL (ref 70–99)
Potassium: 4.1 meq/L (ref 3.5–5.1)
Sodium: 139 meq/L (ref 135–145)
Total Bilirubin: 0.5 mg/dL (ref 0.2–1.2)
Total Protein: 6.8 g/dL (ref 6.0–8.3)

## 2023-07-04 LAB — LIPID PANEL
Cholesterol: 166 mg/dL (ref 0–200)
HDL: 47 mg/dL (ref >39.00)
LDL Cholesterol: 97 mg/dL (ref 0–99)
NonHDL: 119.48
Total CHOL/HDL Ratio: 4
Triglycerides: 110 mg/dL (ref 0.0–149.0)
VLDL: 22 mg/dL (ref 0.0–40.0)

## 2023-07-04 LAB — CBC
HCT: 42.4 % (ref 36.0–46.0)
Hemoglobin: 14 g/dL (ref 12.0–15.0)
MCHC: 33.1 g/dL (ref 30.0–36.0)
MCV: 94.2 fl (ref 78.0–100.0)
Platelets: 264 10*3/uL (ref 150.0–400.0)
RBC: 4.5 Mil/uL (ref 3.87–5.11)
RDW: 12.7 % (ref 11.5–15.5)
WBC: 5.5 10*3/uL (ref 4.0–10.5)

## 2023-07-04 NOTE — Assessment & Plan Note (Signed)
Normal LpA and CT cardiac coronary score Elevated HsCRP:9 No HYPERTENSION or DIABETES or tobacco use or PAD No adverse effects with crestor 10mg  Has maintained heart healthy diet and daily exercise  Repeat lipid panel and CMP Maintain current med dose

## 2023-07-04 NOTE — Progress Notes (Signed)
 Complete physical exam  Patient: Jennifer Cook   DOB: 10-04-1970   53 y.o. Female  MRN: [id number] Visit Date: 07/04/2023  Subjective:    Chief Complaint  Patient presents with   Annual Exam   Jennifer Cook is a 53 y.o. female who presents today for a complete physical exam. She reports consuming a low fat diet. Home exercise routine includes treadmill. She generally feels well. She reports sleeping well. She does have additional problems to discuss today.  Vision:Yes Dental:Yes STD Screen:No  BP Readings from Last 3 Encounters:  07/04/23 112/66  01/30/23 119/65  02/14/22 122/78   Wt Readings from Last 3 Encounters:  07/04/23 156 lb 12.8 oz (71.1 kg)  01/30/23 160 lb 6.4 oz (72.8 kg)  02/14/22 166 lb 6.4 oz (75.5 kg)   Most recent fall risk assessment:    07/04/2023    8:21 AM  Fall Risk   Falls in the past year? 0  Number falls in past yr: 0  Injury with Fall? 0  Risk for fall due to : No Fall Risks  Follow up Falls evaluation completed   Depression screen:Yes - No Depression  Most recent depression screenings:    07/04/2023    8:22 AM 01/30/2023    7:59 AM  PHQ 2/9 Scores  PHQ - 2 Score 0 0  PHQ- 9 Score 1    HPI  She is requesting for quantiferron test today due to annual work requirement. She denies any travel out of country, no upper respiratory symptoms, no abnormal weight loss, no swollen lymph nodes,and No hx of positive TB screen.  Mixed hyperlipidemia Normal LpA and CT cardiac coronary score Elevated HsCRP:9 No HYPERTENSION or DIABETES or tobacco use or PAD No adverse effects with crestor 10mg  Has maintained heart healthy diet and daily exercise  Repeat lipid panel and CMP Maintain current med dose  Past Medical History:  Diagnosis Date   Abnormal vaginal Pap smear    Allergy    HEADACHE 07/29/2007   Qualifier: Diagnosis of  By: Drue Novel MD, Nolon Rod.    History of chicken pox    Hyperlipidemia    Raynaud's disease    Past Surgical  History:  Procedure Laterality Date   bone spur removal Right 03/23/2021   BREAST REDUCTION SURGERY  1996   CESAREAN SECTION  07/1997   COLPOSCOPY     FOOT SURGERY Right    Social History   Socioeconomic History   Marital status: Married    Spouse name: Not on file   Number of children: 2    Years of education: Not on file   Highest education level: Master's degree (e.g., MA, MS, MEng, MEd, MSW, MBA)  Occupational History   Occupation: Airline pilot , Advice worker: Education administrator  Tobacco Use   Smoking status: Never   Smokeless tobacco: Never  Vaping Use   Vaping status: Never Used  Substance and Sexual Activity   Alcohol use: Yes    Comment: Rarely- glass of wine / month or so   Drug use: Never   Sexual activity: Yes    Birth control/protection: Pill  Other Topics Concern   Not on file  Social History Narrative   Lives w/ husband and 2 boys (72, 87 y/o)       Social Drivers of Corporate investment banker Strain: Low Risk  (06/30/2023)   Overall Financial Resource Strain (CARDIA)    Difficulty of Paying Living Expenses: Not hard at all  Food Insecurity: No Food Insecurity (06/30/2023)   Hunger Vital Sign    Worried About Running Out of Food in the Last Year: Never true    Ran Out of Food in the Last Year: Never true  Transportation Needs: No Transportation Needs (06/30/2023)   PRAPARE - Administrator, Civil Service (Medical): No    Lack of Transportation (Non-Medical): No  Physical Activity: Sufficiently Active (06/30/2023)   Exercise Vital Sign    Days of Exercise per Week: 4 days    Minutes of Exercise per Session: 50 min  Stress: No Stress Concern Present (06/30/2023)   [employer] of Occupational Health - Occupational Stress Questionnaire    Feeling of Stress : Not at all  Social Connections: Socially Integrated (06/30/2023)   Social Connection and Isolation Panel [NHANES]    Frequency of Communication with Friends and Family: Three times a week     Frequency of Social Gatherings with Friends and Family: Once a week    Attends Religious Services: More than 4 times per year    Active Member of [employer] or Organizations: Yes    Attends Engineer, structural: More than 4 times per year    Marital Status: Married  Catering manager Violence: Not on file   Family Status  Relation Name Status   Mother  Alive   Father  Alive   Brother  Alive   Mat Aunt  (Not Specified)   Oceanographer  (Not Specified)   MGM  (Not Specified)   MGF  (Not Specified)   PGM  (Not Specified)   PGF  (Not Specified)   Other  (Not Specified)   Neg Hx  (Not Specified)  No partnership data on file   Family History  Problem Relation Age of Onset   Hyperlipidemia Mother    Thyroid disease Mother    Hypertension Mother    CAD Father        F (~ 75 y/o, MI), GM   Heart attack Father    Cancer Maternal Aunt 41       Breast   Cancer Paternal Aunt 33       breast   Stroke Maternal Grandmother    Alcohol abuse Maternal Grandfather    Cancer Maternal Grandfather    Heart disease Paternal Grandmother 54       MI cause of death   Diabetes Paternal Grandmother    Stroke Paternal Grandfather    Breast cancer Other        aunt x 2    Colon cancer Neg Hx    Colon polyps Neg Hx    Esophageal cancer Neg Hx    Stomach cancer Neg Hx    Rectal cancer Neg Hx    Allergies  Allergen Reactions   Hydrocodone Nausea And Vomiting    Bad reflux Bad reflux   Naproxen Nausea And Vomiting    Bad reflux Bad reflux   Tetracycline Hcl    Tetracycline Hcl     Bad reflux    Patient Care Team: Kaytlin Burklow, Bonna Gains, NP as PCP - General (Internal Medicine) Reynold Bowen., MD as Consulting Physician (Obstetrics and Gynecology)   Medications: Outpatient Medications Prior to Visit  Medication Sig   Coenzyme Q10 (CO Q-10 PO) Take by mouth.   levonorgestrel-ethinyl estradiol (SEASONALE) 0.15-0.03 MG tablet TAKE 1 TABLET BY MOUTH EVERY DAY   loratadine (CLARITIN) 10 MG  tablet Take 10 mg by mouth daily.   Probiotic Product (PROBIOTIC  DAILY PO) Take by mouth.   rosuvastatin (CRESTOR) 10 MG tablet Take 1 tablet (10 mg total) by mouth daily.   No facility-administered medications prior to visit.    Review of Systems  Constitutional:  Negative for activity change, appetite change and unexpected weight change.  Respiratory: Negative.    Cardiovascular: Negative.   Gastrointestinal: Negative.   Endocrine: Negative for cold intolerance and heat intolerance.  Genitourinary: Negative.   Musculoskeletal: Negative.   Skin: Negative.   Neurological: Negative.   Hematological: Negative.   Psychiatric/Behavioral:  Negative for behavioral problems, decreased concentration, dysphoric mood, hallucinations, self-injury, sleep disturbance and suicidal ideas. The patient is not nervous/anxious.         Objective:  BP 112/66 (BP Location: Right Arm, Patient Position: Sitting, Cuff Size: Normal)   Pulse 76   Temp 98.2 F (36.8 C) (Temporal)   Ht 5\' 4"  (1.626 m)   Wt 156 lb 12.8 oz (71.1 kg)   SpO2 99%   BMI 26.91 kg/m     Physical Exam Vitals and nursing note reviewed.  Constitutional:      General: She is not in acute distress. HENT:     Right Ear: Tympanic membrane, ear canal and external ear normal.     Left Ear: Tympanic membrane, ear canal and external ear normal.     Nose: Nose normal.  Eyes:     Extraocular Movements: Extraocular movements intact.     Conjunctiva/sclera: Conjunctivae normal.     Pupils: Pupils are equal, round, and reactive to light.  Neck:     Thyroid: No thyroid mass, thyromegaly or thyroid tenderness.  Cardiovascular:     Rate and Rhythm: Normal rate and regular rhythm.     Pulses: Normal pulses.     Heart sounds: Normal heart sounds.  Pulmonary:     Effort: Pulmonary effort is normal.     Breath sounds: Normal breath sounds.  Abdominal:     General: Bowel sounds are normal.     Palpations: Abdomen is soft.   Musculoskeletal:        General: Normal range of motion.     Cervical back: Normal range of motion and neck supple.     Right lower leg: No edema.     Left lower leg: No edema.  Lymphadenopathy:     Cervical: No cervical adenopathy.  Skin:    General: Skin is warm and dry.  Neurological:     Mental Status: She is alert and oriented to person, place, and time.     Cranial Nerves: No cranial nerve deficit.  Psychiatric:        Mood and Affect: Mood normal.        Behavior: Behavior normal.        Thought Content: Thought content normal.      No results found for any visits on 07/04/23.    Assessment & Plan:    Routine Health Maintenance and Physical Exam  Immunization History  Administered Date(s) Administered   DTP 11/07/1971, 01/20/1972, 10/19/1973, 08/03/1977, 05/26/1984   H1N1 04/05/2008   Hepatitis B 03/15/2008, 04/18/2008, 09/23/2008   Influenza Inj Mdck Quad Pf 01/15/2018   Influenza Inj Mdck Quad With Preservative 03/14/2017   Influenza Whole 12/13/2008, 12/06/2009   Influenza, Mdck, Trivalent,PF 6+ MOS(egg free) 01/01/2023   Influenza,inj,Quad PF,6+ Mos 12/27/2014, 01/22/2021   Influenza-Unspecified 03/14/2017, 11/07/2018, 01/18/2020, 01/22/2021, 01/30/2022   MMR 09/30/1972, 09/16/1984   OPV 12/02/1971, 01/20/1972, 06/30/1972, 10/19/1973, 12/29/1977   PFIZER Comirnaty(Gray Top)Covid-19 Tri-Sucrose Vaccine 08/31/2020  PFIZER(Purple Top)SARS-COV-2 Vaccination 06/17/2019, 07/08/2019   PPD Test 03/22/2011, 03/24/2012, 02/01/2013, 11/29/2013, 05/16/2017, 04/28/2018   Td 03/28/1995, 03/06/2005   Tdap 12/27/2014   Zoster Recombinant(Shingrix) 01/30/2022, 07/06/2022    Health Maintenance  Topic Date Due   MAMMOGRAM  05/25/2023   COVID-19 Vaccine (4 - 2024-25 season) 07/20/2023 (Originally 12/08/2022)   DTaP/Tdap/Td (9 - Td or Tdap) 12/26/2024   Colonoscopy  04/03/2026   Cervical Cancer Screening (HPV/Pap Cotest)  11/23/2026   INFLUENZA VACCINE  Completed   HIV  Screening  Completed   Zoster Vaccines- Shingrix  Completed   HPV VACCINES  Aged Out   Hepatitis C Screening  Discontinued    Discussed health benefits of physical activity, and encouraged her to engage in regular exercise appropriate for her age and condition.  Problem List Items Addressed This Visit     Mixed hyperlipidemia   Normal LpA and CT cardiac coronary score Elevated HsCRP:9 No HYPERTENSION or DIABETES or tobacco use or PAD No adverse effects with crestor 10mg  Has maintained heart healthy diet and daily exercise  Repeat lipid panel and CMP Maintain current med dose      Relevant Orders   Lipid panel   Other Visit Diagnoses       Encounter for preventative adult health care exam with abnormal findings    -  Primary   Relevant Orders   Comprehensive metabolic panel with GFR   CBC     Screening-pulmonary TB       Relevant Orders   QuantiFERON-TB Gold Plus      Return in about 1 year (around 07/03/2024) for CPE (fasting).     Alysia Penna, NP

## 2023-07-04 NOTE — Patient Instructions (Addendum)
 Go to lab Maintain Heart healthy diet and daily exercise. Maintain current medications. Due for repeat colonoscopy 03/2026 and PAP smear 11/2026

## 2023-07-07 ENCOUNTER — Encounter: Payer: Self-pay | Admitting: Nurse Practitioner

## 2023-07-07 LAB — QUANTIFERON-TB GOLD PLUS
Mitogen-NIL: 9.79 [IU]/mL
NIL: 0.02 [IU]/mL
QuantiFERON-TB Gold Plus: NEGATIVE
TB1-NIL: 0 [IU]/mL
TB2-NIL: 0 [IU]/mL

## 2023-07-21 ENCOUNTER — Encounter: Payer: Self-pay | Admitting: Family Medicine

## 2023-08-14 ENCOUNTER — Encounter: Payer: Self-pay | Admitting: Nurse Practitioner

## 2024-02-08 ENCOUNTER — Other Ambulatory Visit: Payer: Self-pay | Admitting: Nurse Practitioner

## 2024-02-08 DIAGNOSIS — E782 Mixed hyperlipidemia: Secondary | ICD-10-CM
